# Patient Record
Sex: Male | Born: 1965 | Race: White | Hispanic: No | Smoking: Former smoker
Health system: Southern US, Community
[De-identification: ages and names within clinical notes are randomized; demographics above are authoritative.]

## PROBLEM LIST (undated history)

## (undated) DIAGNOSIS — I10 Essential (primary) hypertension: Secondary | ICD-10-CM

---

## 2012-05-31 ENCOUNTER — Emergency Department (INDEPENDENT_AMBULATORY_CARE_PROVIDER_SITE_OTHER)
Admission: EM | Admit: 2012-05-31 | Discharge: 2012-05-31 | Disposition: A | Discharge status: Home / Self Care | Payer: 59 | Source: Home / Self Care | Attending: Emergency Medicine | Admitting: Emergency Medicine

## 2012-05-31 DIAGNOSIS — R509 Fever, unspecified: Secondary | ICD-10-CM

## 2012-05-31 DIAGNOSIS — R5383 Other fatigue: Secondary | ICD-10-CM

## 2012-05-31 DIAGNOSIS — R5381 Other malaise: Secondary | ICD-10-CM

## 2012-05-31 HISTORY — DX: Essential (primary) hypertension: I10

## 2012-05-31 LAB — POCT URINALYSIS DIP (MANUAL ENTRY)
Leukocytes, UA: NEGATIVE
Protein Ur, POC: 30
Urobilinogen, UA: 0.2 (ref 0–1)

## 2012-05-31 LAB — BASIC METABOLIC PANEL
Glucose, Bld: 114 mg/dL — ABNORMAL HIGH (ref 70–99)
Potassium: 4.1 mEq/L (ref 3.5–5.3)
Sodium: 134 mEq/L — ABNORMAL LOW (ref 135–145)

## 2012-05-31 LAB — POCT CBC W AUTO DIFF (K'VILLE URGENT CARE)

## 2012-05-31 LAB — POCT INFLUENZA A/B: Influenza A, POC: NEGATIVE

## 2012-05-31 MED ORDER — AMOXICILLIN-POT CLAVULANATE 875-125 MG PO TABS: 1.0000 | ORAL_TABLET | Freq: Two times a day (BID) | ORAL | Status: DC

## 2012-05-31 NOTE — ED Provider Notes (Signed)
History     CSN: [id number]  Arrival date & time 05/31/12  0915   First MD Initiated Contact with Patient 05/31/12 [phone]      Chief Complaint  Patient presents with  . Fever    (Consider location/radiation/quality/duration/timing/severity/associated sxs/prior treatment) HPI Marcus Henry is a 46 y.o. male who complains of onset of vague symptoms for 2 days.  The symptoms are constant and mild-moderate in severity.  He also complains that his urine has been darker recently.  No known sick contacts.  No flu shot this year.  He did have some right upper quadrant cramping pain while at work about 2 days ago but that went away after Motrin.  His wife is here with him and states that he is rarely sick and that's why they came here. No sore throat No cough No pleuritic pain No wheezing No nasal congestion No post-nasal drainage No sinus pain/pressure No chest congestion No itchy/red eyes No earache No hemoptysis No SOB + chills/sweats + fever No nausea No vomiting No abdominal pain No diarrhea No skin rashes + fatigue + myalgias + headache    Past Medical History  Diagnosis Date  . Hypertension     borderline    No past surgical history on file.  No family history on file.  History  Substance Use Topics  . Smoking status: Former Games developer  . Smokeless tobacco: Not on file  . Alcohol Use: No      Review of Systems  All other systems reviewed and are negative.    Allergies  Review of patient's allergies indicates no known allergies.  Home Medications   Current Outpatient Rx  Name Route Sig Dispense Refill  . AMOXICILLIN-POT CLAVULANATE 875-125 MG PO TABS Oral Take 1 tablet by mouth 2 (two) times daily. 14 tablet 0    There were no vitals taken for this visit.  Physical Exam  Nursing note and vitals reviewed. Constitutional: He is oriented to person, place, and time. He appears well-developed and well-nourished.  Non-toxic appearance. He does not appear ill.    HENT:  Head: Normocephalic and atraumatic.  Right Ear: Tympanic membrane, external ear and ear canal normal.  Left Ear: Tympanic membrane, external ear and ear canal normal.  Nose: Mucosal edema present. No rhinorrhea.  Mouth/Throat: No oropharyngeal exudate, posterior oropharyngeal edema or posterior oropharyngeal erythema.  Eyes: No scleral icterus.  Neck: Neck supple.  Cardiovascular: Regular rhythm and normal heart sounds.   Pulmonary/Chest: Effort normal and breath sounds normal. No respiratory distress.  Abdominal: There is no tenderness. There is no tenderness at McBurney's point and negative Murphy's sign.  Neurological: He is alert and oriented to person, place, and time.  Skin: Skin is warm and dry. No rash noted.  Psychiatric: He has a normal mood and affect. His speech is normal.    ED Course  Procedures (including critical care time)  Labs Reviewed - No data to display No results found.   1. Fever   2. Fatigue       MDM   Because of his vague symptoms and normal physical examination, the first and we did was a urinalysis and a flu test.  The flu test negative in the urine test only shows a bit of protein but no signs of infection.  A urine culture was sent out.  A CBC was done in clinic in a basic metabolic panel was sent out.  I also gave him prescription for Augmentin and we will followup with him on  the telephone in a few days with test results.  If he is getting worse, he'll need to go to emergency room or followup with his PCP.   Marlaine Hind, MD 05/31/12 [phone]

## 2012-05-31 NOTE — ED Notes (Signed)
States he had a fever on Friday morning denies dysuria and chest pain c/o headache, eye pain, fatigue and dizziness

## 2012-06-01 LAB — URINE CULTURE: Colony Count: NO GROWTH

## 2012-06-03 ENCOUNTER — Telehealth: Payer: Self-pay | Admitting: *Deleted

## 2014-04-22 DIAGNOSIS — S76219A Strain of adductor muscle, fascia and tendon of unspecified thigh, initial encounter: Secondary | ICD-10-CM | POA: Insufficient documentation

## 2015-12-30 ENCOUNTER — Encounter: Payer: Self-pay | Admitting: Emergency Medicine

## 2015-12-30 ENCOUNTER — Emergency Department
Admission: EM | Admit: 2015-12-30 | Discharge: 2015-12-30 | Disposition: A | Discharge status: Home / Self Care | Payer: Managed Care, Other (non HMO) | Source: Home / Self Care | Attending: Emergency Medicine | Admitting: Emergency Medicine

## 2015-12-30 ENCOUNTER — Emergency Department (INDEPENDENT_AMBULATORY_CARE_PROVIDER_SITE_OTHER): Payer: Managed Care, Other (non HMO)

## 2015-12-30 DIAGNOSIS — M25562 Pain in left knee: Secondary | ICD-10-CM | POA: Diagnosis not present

## 2015-12-30 LAB — POCT CBC W AUTO DIFF (K'VILLE URGENT CARE)

## 2015-12-30 MED ORDER — HYDROCODONE-ACETAMINOPHEN 5-325 MG PO TABS: 2.0000 | ORAL_TABLET | ORAL | Status: DC | PRN

## 2015-12-30 MED ORDER — MELOXICAM 7.5 MG PO TABS: 7.5000 mg | ORAL_TABLET | Freq: Every day | ORAL | Status: DC

## 2015-12-30 NOTE — ED Provider Notes (Signed)
CSN: [id number]     Arrival date & time 12/30/15  1505 History   First MD Initiated Contact with Patient 12/30/15 1616     Chief Complaint  Patient presents with  . Knee Pain   (Consider location/radiation/quality/duration/timing/severity/associated sxs/prior Treatment) Patient is a 50 y.o. male presenting with knee pain. The history is provided by the patient. No language interpreter was used.  Knee Pain Location:  Knee Time since incident:  3 months Injury: no   Knee location:  L knee Pain details:    Quality:  Aching   Radiates to:  Does not radiate   Severity:  Moderate   Onset quality:  Gradual   Duration:  3 months   Timing:  Constant   Progression:  Worsening Chronicity:  New Dislocation: no   Foreign body present:  No foreign bodies Relieved by:  Nothing Worsened by:  Nothing tried Ineffective treatments:  None tried Associated symptoms: swelling   Associated symptoms: no muscle weakness   Risk factors: no frequent fractures and no known bone disorder   Pt reports knee pain for 3 months.  Pt has pain with moving leg.  Pt reports occasional pop with pain.  No known injury.  Pt's wife is worried that pt is anemic and wants him to have a cbc as well as evaluation of his knee  Past Medical History  Diagnosis Date  . Hypertension     borderline   History reviewed. No pertinent past surgical history. History reviewed. No pertinent family history. Social History  Substance Use Topics  . Smoking status: Former Research scientist (life sciences)  . Smokeless tobacco: None  . Alcohol Use: No    Review of Systems  Musculoskeletal: Positive for myalgias and joint swelling.  All other systems reviewed and are negative.   Allergies  Review of patient's allergies indicates no known allergies.  Home Medications   Prior to Admission medications   Medication Sig Start Date End Date Taking? Authorizing Provider  amoxicillin-clavulanate (AUGMENTIN) 875-125 MG per tablet Take 1 tablet by mouth 2  (two) times daily. 05/31/12   Janeann Forehand, MD  HYDROcodone-acetaminophen (NORCO/VICODIN) 5-325 MG tablet Take 2 tablets by mouth every 4 (four) hours as needed. 12/30/15   Fransico Meadow, PA-C  meloxicam (MOBIC) 7.5 MG tablet Take 1 tablet (7.5 mg total) by mouth daily. 12/30/15   Fransico Meadow, PA-C   Meds Ordered and Administered this Visit  Medications - No data to display  BP 143/74 mmHg  Pulse 76  Temp(Src) 99.1 F (37.3 C) (Oral)  Resp 16  Ht 6\' 2"  (1.88 m)  Wt 246 lb (111.585 kg)  BMI 31.57 kg/m2  SpO2 97% No data found.   Physical Exam  Constitutional: He is oriented to person, place, and time. He appears well-developed and well-nourished.  HENT:  Head: Normocephalic.  Eyes: EOM are normal.  Neck: Normal range of motion.  Abdominal: He exhibits no distension.  Musculoskeletal: He exhibits tenderness.  Tender right knee medial aspect,  Pain with movement,  nv and ns intact  No instability,  Negative drawer.   Neurological: He is alert and oriented to person, place, and time.  Psychiatric: He has a normal mood and affect.  Nursing note and vitals reviewed.   ED Course  Procedures (including critical care time)  Labs Review Labs Reviewed  POCT CBC W AUTO DIFF ([place])   CBC is normal   Imaging Review Dg Knee Complete 4 Views Left  12/30/2015  CLINICAL DATA:  Knee pain for 3 months.  No trauma. EXAM: LEFT KNEE - COMPLETE 4+ VIEW COMPARISON:  None. FINDINGS: No acute fracture or dislocation. A tiny calcification adjacent to the medial distal femur is consistent with a previous medial collateral ligament injury. No acute abnormalities. IMPRESSION: No acute abnormalities. Electronically Signed   By: Dorise Bullion III M.D   On: 12/30/2015 16:10     Visual Acuity Review  Right Eye Distance:   Left Eye Distance:   Bilateral Distance:    Right Eye Near:   Left Eye Near:    Bilateral Near:         MDM xray shows possible calcification from  medial collateral tear.  Pt    1. Knee pain, left    Meds ordered this encounter  Medications  . meloxicam (MOBIC) 7.5 MG tablet    Sig: Take 1 tablet (7.5 mg total) by mouth daily.    Dispense:  30 tablet    Refill:  0    Order Specific Question:  Supervising Provider    Answer:  Burnett Harry, DAVID [[id number]]  . HYDROcodone-acetaminophen (NORCO/VICODIN) 5-325 MG tablet    Sig: Take 2 tablets by mouth every 4 (four) hours as needed.    Dispense:  10 tablet    Refill:  0    Order Specific Question:  Supervising Provider    Answer:  Burnett Harry, DAVID [[id number]]  An After Visit Summary was printed and given to the patient.    [place], PA-C 12/30/15 [phone]

## 2015-12-30 NOTE — ED Notes (Signed)
Reports pain in left knee at intermittent levels of severity for past 3 months; onset not associated with any injury or activity; has tried OTCs, ice, heat, braces and patches without relief.

## 2015-12-30 NOTE — Discharge Instructions (Signed)
Medial Collateral Knee Ligament Sprain With Phase I Rehab The medial collateral ligament (MCL) of the knee helps hold the knee joint in proper alignment and prevents the bones from shifting out of alignment (displacing) to the inside (medially). Injury to the knee may cause a tear in the MCL ligament (sprain). Sprains may heal without treatment, but this often results in a loose joint. Sprains are classified into three categories. Grade 1 sprains cause pain, but the tendon is not lengthened. Grade 2 sprains include a lengthened ligament, due to the ligament being stretched or partially ruptured. With grade 2 sprains, there is still function, although possibly decreased. Grade 3 sprains involve a complete tear of the tendon or muscle, and function is usually impaired. SYMPTOMS   Pain and tenderness on the inner side of the knee.  A "pop," tearing or pulling sensation at the time of injury.  Bruising (contusion) at the site of injury, within 48 hours of injury.  Knee stiffness.  Limping, often walking with the knee bent. CAUSES  An MCL sprain occurs when a force is placed on the ligament that is greater than it can handle. Common mechanisms of injury include:  Direct hit (trauma) to the outer side of the knee, especially if the foot is planted on the ground.  Forceful pivoting of the body and leg, while the foot is planted on the ground. RISK INCREASES WITH:  Contact sports (football, rugby).  Sports that require pivoting or cutting (soccer).  Poor knee strength and flexibility.  Improper equipment use. PREVENTION  Warm up and stretch properly before activity.  Maintain physical fitness:  Strength, flexibility and endurance.  Cardiovascular fitness.  Wear properly fitted protective equipment (correct length of cleats for surface).  Functional braces may be effective in preventing injury. PROGNOSIS  MCL tears usually heal without the need for surgery. Sometimes however, surgery  is required. RELATED COMPLICATIONS  Frequently recurring symptoms, such as the knee giving way, knee instability or knee swelling.  Injury to other structures in the knee joint:  Meniscal cartilage, resulting in locking and swelling of the knee.  Articular cartilage, resulting in knee arthritis.  Other ligaments of the knee.  Injury to nerves, resulting in numbness of the outer leg, foot or ankle and weakness or paralysis, with inability to raise the ankle or toes.  Knee stiffness. TREATMENT Treatment first involves the use of ice and medicine, to reduce pain and inflammation. The use of strengthening and stretching exercises may help reduce pain with activity. These exercises may be performed at home, but referral to a therapist is often advised. You may be advised to walk with crutches until you are able to walk without a limp. Your caregiver may provide you with a hinged knee brace to help regain a full range of motion, while also protecting the injured knee. For severe MCL injuries or injuries that involve other ligaments of the knee, surgery is often advised. MEDICATION  Do not take pain medicine for 7 days before surgery.  Only use over-the-counter pain medicine as directed by your caregiver.  Only use prescription pain relievers as directed and only in needed amounts. HEAT AND COLD  Cold treatment (icing) should be applied for 10 to 15 minutes every 2 to 3 hours for inflammation and pain, and immediately after any activity, that aggravates the symptoms. Use ice packs or an ice massage.  Heat treatment may be used before performing stretching and strengthening activities prescribed by your caregiver, physical therapist or athletic trainer. Use  a heat pack or warm water soak. SEEK MEDICAL CARE IF:   Symptoms get worse or do not improve in 4 to 6 weeks, despite treatment.  New, unexplained symptoms develop. EXERCISES  PHASE I EXERCISES  RANGE OF MOTION (ROM) AND STRETCHING  EXERCISES-Medial Collateral Knee Ligament Sprain Phase I These are some of the initial exercises that your physician, physical therapist or athletic trainer may have you perform to begin your rehabilitation. When you demonstrate gains in your flexibility and strength, your caregiver may progress you to Phase II exercises. As you perform these exercises, remember:  These initial exercises are intended to be gentle. They will help you restore motion without increasing any swelling.  Completing these exercises allows less painful movement and prepares you for the more aggressive strengthening exercises in Phase II.  An effective stretch should be held for at least 30 seconds.  A stretch should never be painful. You should only feel a gentle lengthening or release in the stretched tissue. RANGE OF MOTION-Knee Flexion, Active  Lie on your back with both knees straight. (If this causes back discomfort, bend your healthy knee, placing your foot flat on the floor.)  Slowly slide your heel back toward your buttocks until you feel a gentle stretch in the front of your knee or thigh.  Hold for __________ seconds. Slowly slide your heel back to the starting position. Repeat __________ times. Complete this exercise __________ times per day. STRETCH-Knee Flexion, Supine  Lie on the floor with your right / left heel and foot lightly touching the wall. (Place both feet on the wall if you do not use a door frame.)  Without using any effort, allow gravity to slide your foot down the wall slowly until you feel a gentle stretch in the front of your right / left knee.  Hold this stretch for __________ seconds. Then return the leg to the starting position, using your health leg for help, if needed. Repeat __________ times. Complete this stretch __________ times per day. RANGE OF MOTION-Knee Flexion and Extension, Active-Assisted  Sit on the edge of a table or chair with your thighs firmly supported. It may be  helpful to place a folded towel under the end of your right / left thigh.  Flexion (bending): Place the ankle of your healthy leg on top of the other ankle. Use your healthy leg to gently bend your right / left knee until you feel a mild tension across the top of your knee.  Hold for __________ seconds.  Extension (straightening): Switch your ankles so your right / left leg is on top. Use your healthy leg to straighten your right / left knee until you feel a mild tension on the backside of your knee.  Hold for __________ seconds. Repeat __________ times. Complete this exercise __________ times per day. STRETCH-Knee Extension Sitting  Sit with your right / left leg/heel propped on another chair, coffee table, or foot stool.  Allow your leg muscles to relax, letting gravity straighten out your knee.*  You should feel a stretch behind your right / left knee. Hold this position for __________ seconds. Repeat __________ times. Complete this stretch __________ times per day. *Your physician, physical therapist or athletic trainer may instruct you to place a __________ weight on your thigh, just above your kneecap, to deepen the stretch. STRENGTHENING EXERCISES-Medial Collateral Knee Ligament Sprain Phase I These exercises may help you when beginning to rehabilitate your injury. They may resolve your symptoms with or without further involvement from  your physician, physical therapist or athletic trainer. While completing these exercises, remember:   In order to return to more demanding activities, you will likely need to progress to more challenging exercises. Your physician, physical therapist or athletic trainer will advance your exercises when your tissues show adequate healing and your muscles demonstrate increased strength.  Muscles can gain both the endurance and the strength needed for everyday activities through controlled exercises.  Complete these exercises as instructed by your  physician, physical therapist or athletic trainer. Increase the resistance and repetitions only as guided by your caregiver. STRENGTH-Quadriceps, Isometrics  Lie on your back with your right / left leg extended and your opposite knee bent.  Gradually tense the muscles in the front of your right / left thigh. You should see either your kneecap slide up toward your hip or an increased dimpling just above the knee. This motion will push the back of the knee down toward the floor, mat or bed on which you are lying.  Hold the muscle as tight as you can without increasing your pain for __________ seconds.  Relax the muscles slowly and completely in between each repetition. Repeat __________ times. Complete this exercise __________ times per day. STRENGTH-Quadriceps, Short Arcs  Lie on your back. Place a __________ inch towel roll under your knee so that the knee slightly bends.  Raise only your lower leg by tightening the muscles in the front of your thigh. Do not allow your thigh to rise.  Hold this position for __________ seconds. Repeat __________ times. Complete this exercise __________ times per day. OPTIONAL ANKLE WEIGHTS: Begin with ____________________, but DO NOT exceed ____________________. Increase in 1 pound/0.5 kilogram increments.  STRENGTH--Quadriceps, Straight Leg Raises Quality counts! Watch for signs that the quadriceps muscle is working, to be sure you are strengthening the correct muscles and not "cheating" by substituting with healthier muscles.  Lay on your back with your right / left leg extended and your opposite knee bent.  Tense the muscles in the front of your right / left thigh. You should see either your knee cap slide up or increased dimpling just above the knee. Your thigh may even shake a bit.  Tighten these muscles even more and raise your leg 4 to 6 inches off the floor. Hold for __________ seconds.  Keeping these muscles tense, lower your leg.  Relax the  muscles slowly and completely in between each repetition. Repeat __________ times. Complete this exercise __________ times per day. STRENGTH-Hamstring, Isometrics  Lie on your back on a firm surface.  Bend your right / left knee approximately __________ degrees.  Dig your heel into the surface as if you are trying to pull it toward your buttocks. Tighten the muscles in the back of your thighs to "dig" as hard as you can, without increasing any pain.  Hold this position for __________ seconds.  Release the tension gradually and allow your muscle to completely relax for __________ seconds in between each exercise. Repeat __________ times. Complete this exercise __________ times per day. STRENGTH-Hamstring, Curls  Lay on your stomach with your legs extended. (If you lay on a bed, your feet may hang over the edge.)  Tighten the muscles in the back of your thigh to bend your right / left knee up to 90 degrees. Keep your hips flat on the bed.  Hold this position for __________ seconds.  Slowly lower your leg back to the starting position. Repeat __________ times. Complete this exercise __________ times per day. OPTIONAL  ANKLE WEIGHTS: Begin with ____________________, but DO NOT exceed ____________________. Increase in 1 pound/0.5 kilogram increments.    This information is not intended to replace advice given to you by your health care provider. Make sure you discuss any questions you have with your health care provider.   Document Released: 08/13/2005 Document Revised: 09/03/2014 Document Reviewed: 11/25/2008 Elsevier Interactive Patient Education [employer].

## 2016-01-20 ENCOUNTER — Encounter: Payer: Self-pay | Admitting: Sports Medicine

## 2016-01-20 ENCOUNTER — Ambulatory Visit (INDEPENDENT_AMBULATORY_CARE_PROVIDER_SITE_OTHER): Payer: Managed Care, Other (non HMO) | Admitting: Sports Medicine

## 2016-01-20 VITALS — BP 122/72 | HR 74 | Resp 16 | Wt 250.1 lb

## 2016-01-20 DIAGNOSIS — M1712 Unilateral primary osteoarthritis, left knee: Secondary | ICD-10-CM | POA: Diagnosis not present

## 2016-01-20 NOTE — Assessment & Plan Note (Signed)
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

## 2016-01-20 NOTE — Progress Notes (Signed)
   Subjective:    I'm seeing this patient as a consultation for: Caryl Ada, PA-C   CC: Left knee pain  HPI: For months this pleasant 50 year old male has had pain that he localizes along the medial aspect of his left knee, moderate, persistent without trauma, mild swelling. No mechanical symptoms.  Past medical history, Surgical history, Family history not pertinant except as noted below, Social history, Allergies, and medications have been entered into the medical record, reviewed, and no changes needed.   Review of Systems: No headache, visual changes, nausea, vomiting, diarrhea, constipation, dizziness, abdominal pain, skin rash, fevers, chills, night sweats, weight loss, swollen lymph nodes, body aches, joint swelling, muscle aches, chest pain, shortness of breath, mood changes, visual or auditory hallucinations.   Objective:   General: Well Developed, well nourished, and in no acute distress.  Neuro/Psych: Alert and oriented x3, extra-ocular muscles intact, able to move all 4 extremities, sensation grossly intact. Skin: Warm and dry, no rashes noted.  Respiratory: Not using accessory muscles, speaking in full sentences, trachea midline.  Cardiovascular: Pulses palpable, no extremity edema. Abdomen: Does not appear distended. Left Knee: Swollen with a mild effusion and tenderness at the medial joint line. ROM normal in flexion and extension and lower leg rotation. Ligaments with solid consistent endpoints including ACL, PCL, LCL, MCL. Negative Mcmurray's and provocative meniscal tests. Non painful patellar compression. Patellar and quadriceps tendons unremarkable. Hamstring and quadriceps strength is normal.  Procedure: Real-time Ultrasound Guided Injection of left knee Device: GE Logiq E  Verbal informed consent obtained.  Time-out conducted.  Noted no overlying erythema, induration, or other signs of local infection.  Skin prepped in a sterile fashion.  Local anesthesia:  Topical Ethyl chloride.  With sterile technique and under real time ultrasound guidance:  1 mL kenalog 40, 2 mL lidocaine, 2 mL Marcaine injected easily. Completed without difficulty  Pain immediately resolved suggesting accurate placement of the medication.  Advised to call if fevers/chills, erythema, induration, drainage, or persistent bleeding.  Images permanently stored and available for review in the ultrasound unit.  Impression: Technically successful ultrasound guided injection.  Impression and Recommendations:   This case required medical decision making of moderate complexity.

## 2016-02-03 ENCOUNTER — Ambulatory Visit (INDEPENDENT_AMBULATORY_CARE_PROVIDER_SITE_OTHER): Payer: Managed Care, Other (non HMO) | Admitting: Physician Assistant

## 2016-02-03 ENCOUNTER — Encounter: Payer: Self-pay | Admitting: Physician Assistant

## 2016-02-03 ENCOUNTER — Other Ambulatory Visit: Payer: Self-pay | Admitting: Physician Assistant

## 2016-02-03 VITALS — BP 108/65 | HR 86 | Ht 74.0 in | Wt 247.0 lb

## 2016-02-03 DIAGNOSIS — E669 Obesity, unspecified: Secondary | ICD-10-CM

## 2016-02-03 DIAGNOSIS — Z0000 Encounter for general adult medical examination without abnormal findings: Secondary | ICD-10-CM

## 2016-02-03 DIAGNOSIS — R5383 Other fatigue: Secondary | ICD-10-CM | POA: Diagnosis not present

## 2016-02-03 DIAGNOSIS — R0681 Apnea, not elsewhere classified: Secondary | ICD-10-CM

## 2016-02-03 DIAGNOSIS — Z13220 Encounter for screening for lipoid disorders: Secondary | ICD-10-CM

## 2016-02-03 DIAGNOSIS — R0683 Snoring: Secondary | ICD-10-CM

## 2016-02-03 DIAGNOSIS — Z131 Encounter for screening for diabetes mellitus: Secondary | ICD-10-CM

## 2016-02-03 DIAGNOSIS — Q674 Other congenital deformities of skull, face and jaw: Secondary | ICD-10-CM

## 2016-02-03 DIAGNOSIS — R0602 Shortness of breath: Secondary | ICD-10-CM

## 2016-02-03 NOTE — Patient Instructions (Addendum)
Tumeric 500mg  twice a day for joint pain.   Will get scheduled for sleep study.   Will call with lab results.   Keeping you healthy  Get these tests  Blood pressure- Have your blood pressure checked once a year by your healthcare provider.  Normal blood pressure is 120/80.  Weight- Have your body mass index (BMI) calculated to screen for obesity.  BMI is a measure of body fat based on height and weight. You can also calculate your own BMI at [employer].it.  Cholesterol- Have your cholesterol checked regularly starting at age 61, sooner may be necessary if you have diabetes, high blood pressure, if a family member developed heart diseases at an early age or if you smoke.   Chlamydia, HIV, and other sexual transmitted disease- Get screened each year until the age of 6 then within three months of each new sexual partner.  Diabetes- Have your blood sugar checked regularly if you have high blood pressure, high cholesterol, a family history of diabetes or if you are overweight.  Get these vaccines  Flu shot- Every fall.  Tetanus shot- Every 10 years.  Menactra- Single dose; prevents meningitis.  Take these steps  Don't smoke- If you do smoke, ask your healthcare provider about quitting. For tips on how to quit, go to www.smokefree.gov or call 1-800-QUIT-NOW.  Be physically active- Exercise 5 days a week for at least 30 minutes.  If you are not already physically active start slow and gradually work up to 30 minutes of moderate physical activity.  Examples of moderate activity include walking briskly, mowing the yard, dancing, swimming bicycling, etc.  Eat a healthy diet- Eat a variety of healthy foods such as fruits, vegetables, low fat milk, low fat cheese, yogurt, lean meats, poultry, fish, beans, tofu, etc.  For more information on healthy eating, go to www.thenutritionsource.org  Drink alcohol in moderation- Limit alcohol intake two drinks or less a day.  Never drink  and drive.  Dentist- Brush and floss teeth twice daily; visit your dentis twice a year.  Depression-Your emotional health is as important as your physical health.  If you're feeling down, losing interest in things you normally enjoy please talk with your healthcare provider.  Gun Safety- If you keep a gun in your home, keep it unloaded and with the safety lock on.  Bullets should be stored separately.  Helmet use- Always wear a helmet when riding a motorcycle, bicycle, rollerblading or skateboarding.  Safe sex- If you may be exposed to a sexually transmitted infection, use a condom  Seat belts- Seat bels can save your life; always wear one.  Smoke/Carbon Monoxide detectors- These detectors need to be installed on the appropriate level of your home.  Replace batteries at least once a year.  Skin Cancer- When out in the sun, cover up and use sunscreen SPF 15 or higher.  Violence- If anyone is threatening or hurting you, please tell your healthcare provider.

## 2016-02-04 LAB — CBC WITH DIFFERENTIAL/PLATELET
BASOS PCT: 1 %
Basophils Absolute: 64 cells/uL (ref 0–200)
Eosinophils Absolute: 128 cells/uL (ref 15–500)
Eosinophils Relative: 2 %
HEMATOCRIT: 45.2 % (ref 38.5–50.0)
Hemoglobin: 15.1 g/dL (ref 13.2–17.1)
LYMPHS ABS: 1792 {cells}/uL (ref 850–3900)
LYMPHS PCT: 28 %
MCH: 30.1 pg (ref 27.0–33.0)
MCHC: 33.4 g/dL (ref 32.0–36.0)
MCV: 90.2 fL (ref 80.0–100.0)
MPV: 10.9 fL (ref 7.5–12.5)
Monocytes Absolute: 320 cells/uL (ref 200–950)
Monocytes Relative: 5 %
NEUTROS ABS: 4096 {cells}/uL (ref 1500–7800)
Neutrophils Relative %: 64 %
Platelets: 217 10*3/uL (ref 140–400)
RBC: 5.01 MIL/uL (ref 4.20–5.80)
RDW: 13.9 % (ref 11.0–15.0)
WBC: 6.4 10*3/uL (ref 3.8–10.8)

## 2016-02-04 LAB — COMPLETE METABOLIC PANEL WITH GFR
ALT: 44 U/L (ref 9–46)
AST: 30 U/L (ref 10–40)
Albumin: 4.1 g/dL (ref 3.6–5.1)
Alkaline Phosphatase: 49 U/L (ref 40–115)
BILIRUBIN TOTAL: 0.6 mg/dL (ref 0.2–1.2)
BUN: 19 mg/dL (ref 7–25)
CHLORIDE: 104 mmol/L (ref 98–110)
CO2: 22 mmol/L (ref 20–31)
CREATININE: 0.87 mg/dL (ref 0.60–1.35)
Calcium: 8.8 mg/dL (ref 8.6–10.3)
GFR, Est Non African American: >89 mL/min (ref >=60)
GLUCOSE: 116 mg/dL — AB (ref 65–99)
Potassium: 4.2 mmol/L (ref 3.5–5.3)
SODIUM: 139 mmol/L (ref 135–146)
TOTAL PROTEIN: 6.6 g/dL (ref 6.1–8.1)

## 2016-02-04 LAB — LIPID PANEL
Cholesterol: 223 mg/dL — ABNORMAL HIGH (ref 125–200)
HDL: 50 mg/dL (ref >=40)
LDL CALC: 142 mg/dL — AB (ref <130)
Total CHOL/HDL Ratio: 4.5 Ratio (ref <=5.0)
Triglycerides: 156 mg/dL — ABNORMAL HIGH (ref <150)
VLDL: 31 mg/dL — AB (ref <30)

## 2016-02-04 LAB — VITAMIN B12: VITAMIN B 12: 1150 pg/mL — AB (ref 200–1100)

## 2016-02-04 LAB — TSH: TSH: 0.74 m[IU]/L (ref 0.40–4.50)

## 2016-02-04 LAB — VITAMIN D 25 HYDROXY (VIT D DEFICIENCY, FRACTURES): Vit D, 25-Hydroxy: 22 ng/mL — ABNORMAL LOW (ref 30–100)

## 2016-02-06 ENCOUNTER — Encounter: Payer: Self-pay | Admitting: Physician Assistant

## 2016-02-06 DIAGNOSIS — R5383 Other fatigue: Secondary | ICD-10-CM | POA: Insufficient documentation

## 2016-02-06 DIAGNOSIS — R7301 Impaired fasting glucose: Secondary | ICD-10-CM | POA: Insufficient documentation

## 2016-02-06 DIAGNOSIS — R0681 Apnea, not elsewhere classified: Secondary | ICD-10-CM | POA: Insufficient documentation

## 2016-02-06 DIAGNOSIS — R0602 Shortness of breath: Secondary | ICD-10-CM | POA: Insufficient documentation

## 2016-02-06 DIAGNOSIS — E669 Obesity, unspecified: Secondary | ICD-10-CM | POA: Insufficient documentation

## 2016-02-06 DIAGNOSIS — Q674 Other congenital deformities of skull, face and jaw: Secondary | ICD-10-CM | POA: Insufficient documentation

## 2016-02-06 DIAGNOSIS — E559 Vitamin D deficiency, unspecified: Secondary | ICD-10-CM | POA: Insufficient documentation

## 2016-02-06 DIAGNOSIS — R0683 Snoring: Secondary | ICD-10-CM | POA: Insufficient documentation

## 2016-02-06 NOTE — Progress Notes (Signed)
Subjective:    Patient ID: Marcus Henry, male    DOB: 1966-01-12, 50 y.o.   MRN: [id number]  HPI Pt is a 50 yo male who presents to the clinic with his wife to establish care.   .. Active Ambulatory Problems    Diagnosis Date Noted  . Primary osteoarthritis of left knee 01/20/2016   Resolved Ambulatory Problems    Diagnosis Date Noted  . No Resolved Ambulatory Problems   Past Medical History  Diagnosis Date  . Hypertension    .Marland Kitchen Social History   Social History  . Marital Status: Married    Spouse Name: N/A  . Number of Children: N/A  . Years of Education: N/A   Occupational History  . Not on file.   Social History Main Topics  . Smoking status: Former Research scientist (life sciences)  . Smokeless tobacco: Not on file  . Alcohol Use: Yes  . Drug Use: No  . Sexual Activity: Yes   Other Topics Concern  . Not on file   Social History Narrative    Patient is on been seen by health care provider since at least 2015. He does have a few concerns today. He is having some fatigue. It seems to worsening over the last month. He does work third shift and has to sleep during the day. His wife works regular shift and at times would like to spend time with him during the day and he is not sleeping. He has no trouble getting to sleep. His wife does report that he snores and there are times where she feels like he is not breathing. Patient has always had trouble breathing out of his nose and wonders if he could have a deviated septum. He is also had some episodes of shortness of breath. Denies any wheezing or coughing. He just that time when he moves from location to location social breath. He admits he does not exercise and is not in the great physical shape. He just feels like he has to work and sleeps and that is all he does.   Review of Systems  All other systems reviewed and are negative.      Objective:   Physical Exam BP 108/65 mmHg  Pulse 86  Ht 6\' 2"  (1.88 m)  Wt 247 lb (112.038 kg)  BMI  31.70 kg/m2  PF 653 L/min  General Appearance:    Alert, cooperative, no distress, appears stated age, obese  Head:    Normocephalic, without obvious abnormality, atraumatic  Eyes:    PERRL, conjunctiva/corneas clear, EOM's intact, fundi    benign, both eyes       Ears:    Normal TM's and external ear canals, both ears  Nose:   Nares normal, deviated septum to the left, mucosa normal, no drainage    or sinus tenderness  Throat:   Lips, mucosa, and tongue normal; teeth and gums normal  Neck:   Supple, symmetrical, trachea midline, no adenopathy;       thyroid:  No enlargement/tenderness/nodules; no carotid   bruit or JVD  Back:     Symmetric, no curvature, ROM normal, no CVA tenderness  Lungs:     Clear to auscultation bilaterally, respirations unlabored  Chest wall:    No tenderness or deformity  Heart:    Regular rate and rhythm, S1 and S2 normal, no murmur, rub   or gallop  Abdomen:     Soft, non-tender, bowel sounds active all four quadrants,    no masses, no organomegaly  Extremities:   Extremities normal, atraumatic, no cyanosis or edema  Pulses:   2+ and symmetric all extremities  Skin:   Skin color, texture, turgor normal, no rashes or lesions  Lymph nodes:   Cervical, supraclavicular, and axillary nodes normal  Neurologic:   CNII-XII intact. Normal strength, sensation and reflexes      throughout         Assessment & Plan:  CPE- lipid and cmp ordered. Encouraged asa 81mg  daily. Encouraged weight loss. AUA was 0.   Obesity/snoring/apenic episode/no energy- TSH, b12, vitamin D ordered. STOP BANG was high risk with 5 yes answers. Ordered sleep study testing. Discussed weight loss. Come back to discuss more.   Deviated septum- ENT referral. If repaired could help sleep apnea and SOB. Denies any trauma to the nose.   SOB- could be due to deviated septum. ENT referral placed. Peak flow in green. reassured patient.  Follow up if worsening.

## 2016-02-07 LAB — HEMOGLOBIN A1C
HEMOGLOBIN A1C: 6.3 % — AB (ref <5.7)
Mean Plasma Glucose: 134 mg/dL

## 2016-02-08 ENCOUNTER — Encounter: Payer: Self-pay | Admitting: Physician Assistant

## 2016-02-08 DIAGNOSIS — R7303 Prediabetes: Secondary | ICD-10-CM | POA: Insufficient documentation

## 2016-02-08 DIAGNOSIS — E291 Testicular hypofunction: Secondary | ICD-10-CM | POA: Insufficient documentation

## 2016-02-08 LAB — TESTOSTERONE, FREE AND TOTAL (INCLUDES SHBG)-(MALES)
Sex Hormone Binding: 21 nmol/L (ref 10–50)
TESTOSTERONE FREE: 55 pg/mL (ref 47.0–244.0)
TESTOSTERONE-% FREE: 2.4 % (ref 1.6–2.9)
Testosterone: 226 ng/dL — ABNORMAL LOW (ref 250–827)

## 2016-02-10 ENCOUNTER — Other Ambulatory Visit: Payer: Self-pay | Admitting: *Deleted

## 2016-02-10 MED ORDER — VITAMIN D (ERGOCALCIFEROL) 1.25 MG (50000 UNIT) PO CAPS: 50000.0000 [IU] | ORAL_CAPSULE | ORAL | Status: DC

## 2016-02-17 ENCOUNTER — Encounter: Payer: Self-pay | Admitting: Sports Medicine

## 2016-02-17 ENCOUNTER — Ambulatory Visit (INDEPENDENT_AMBULATORY_CARE_PROVIDER_SITE_OTHER): Payer: Managed Care, Other (non HMO) | Admitting: Sports Medicine

## 2016-02-17 DIAGNOSIS — E291 Testicular hypofunction: Secondary | ICD-10-CM

## 2016-02-17 DIAGNOSIS — M1712 Unilateral primary osteoarthritis, left knee: Secondary | ICD-10-CM | POA: Diagnosis not present

## 2016-02-17 DIAGNOSIS — R7989 Other specified abnormal findings of blood chemistry: Secondary | ICD-10-CM

## 2016-02-17 NOTE — Assessment & Plan Note (Signed)
Has a low testosterone level, needs a recheck before we can consider supplementation.

## 2016-02-17 NOTE — Progress Notes (Signed)
  Subjective:    CC: Follow-up  HPI: Left knee osteoarthritis: Likely degenerative meniscal tear, good response to the injection monthly ago but still has some pain, swelling.  Hypogonadism: Needs a single additional low testosterone level before we consider supplementation.  Past medical history, Surgical history, Family history not pertinant except as noted below, Social history, Allergies, and medications have been entered into the medical record, reviewed, and no changes needed.   Review of Systems: No fevers, chills, night sweats, weight loss, chest pain, or shortness of breath.   Objective:    General: Well Developed, well nourished, and in no acute distress.  Neuro: Alert and oriented x3, extra-ocular muscles intact, sensation grossly intact.  HEENT: Normocephalic, atraumatic, pupils equal round reactive to light, neck supple, no masses, no lymphadenopathy, thyroid nonpalpable.  Skin: Warm and dry, no rashes. Cardiac: Regular rate and rhythm, no murmurs rubs or gallops, no lower extremity edema.  Respiratory: Clear to auscultation bilaterally. Not using accessory muscles, speaking in full sentences. Left Knee: Swollen with a palpable fluid wave and effusion and tenderness at the medial joint line ROM normal in flexion and extension and lower leg rotation. Ligaments with solid consistent endpoints including ACL, PCL, LCL, MCL. Negative Mcmurray's and provocative meniscal tests. Non painful patellar compression. Patellar and quadriceps tendons unremarkable. Hamstring and quadriceps strength is normal.  Procedure: Real-time Ultrasound Guided Injection of left knee Device: GE Logiq E  Verbal informed consent obtained.  Time-out conducted.  Noted no overlying erythema, induration, or other signs of local infection.  Skin prepped in a sterile fashion.  Local anesthesia: Topical Ethyl chloride.  With sterile technique and under real time ultrasound guidance:  Aspirated 20 mL  straw-colored fluid, syringe switched and 1 mL kenalog 40, 2 mL lidocaine, 2 mL Marcaine injected easily. Completed without difficulty  Pain immediately resolved suggesting accurate placement of the medication.  Advised to call if fevers/chills, erythema, induration, drainage, or persistent bleeding.  Images permanently stored and available for review in the ultrasound unit.  Impression: Technically successful ultrasound guided injection.  Impression and Recommendations:

## 2016-02-17 NOTE — Assessment & Plan Note (Signed)
Repeat aspiration and injection, at this point we are going to order an MRI, I do suspect he will proceed to arthroscopy. Not having any mechanical symptoms however.

## 2016-02-18 LAB — TESTOSTERONE: Testosterone: 161 ng/dL — ABNORMAL LOW (ref 250–827)

## 2016-02-20 ENCOUNTER — Other Ambulatory Visit: Payer: Self-pay | Admitting: *Deleted

## 2016-02-20 ENCOUNTER — Ambulatory Visit (INDEPENDENT_AMBULATORY_CARE_PROVIDER_SITE_OTHER): Payer: Managed Care, Other (non HMO)

## 2016-02-20 DIAGNOSIS — M1712 Unilateral primary osteoarthritis, left knee: Secondary | ICD-10-CM

## 2016-02-20 DIAGNOSIS — M23222 Derangement of posterior horn of medial meniscus due to old tear or injury, left knee: Secondary | ICD-10-CM

## 2016-02-20 MED ORDER — VITAMIN D (ERGOCALCIFEROL) 1.25 MG (50000 UNIT) PO CAPS: 50000.0000 [IU] | ORAL_CAPSULE | ORAL | Status: DC

## 2016-02-21 ENCOUNTER — Ambulatory Visit: Payer: Managed Care, Other (non HMO) | Admitting: Sports Medicine

## 2016-02-23 ENCOUNTER — Ambulatory Visit (INDEPENDENT_AMBULATORY_CARE_PROVIDER_SITE_OTHER): Payer: Managed Care, Other (non HMO) | Admitting: Sports Medicine

## 2016-02-23 ENCOUNTER — Encounter: Payer: Self-pay | Admitting: Sports Medicine

## 2016-02-23 VITALS — BP 146/83 | HR 72 | Wt 244.0 lb

## 2016-02-23 DIAGNOSIS — E669 Obesity, unspecified: Secondary | ICD-10-CM

## 2016-02-23 DIAGNOSIS — M1712 Unilateral primary osteoarthritis, left knee: Secondary | ICD-10-CM

## 2016-02-23 DIAGNOSIS — E291 Testicular hypofunction: Secondary | ICD-10-CM

## 2016-02-23 MED ORDER — TESTOSTERONE CYPIONATE 200 MG/ML IM SOLN
200.0000 mg | Freq: Once | INTRAMUSCULAR | Status: AC
  Administered 2016-02-23: 200 mg via INTRAMUSCULAR

## 2016-02-23 NOTE — Progress Notes (Signed)
  Subjective:    CC: Follow-up  HPI: Left knee osteoarthritis: Needed a second injection, overall doing well now.  Obesity: Would like to think about weight loss medication, he will let either myself or his PCP know what he wants to do.  Hypergonadism: With 2 low testosterone levels, fatigue, difficulty putting on lean muscle, agreeable to start testosterone injections, risks, benefits, alternatives discussed.  Past medical history, Surgical history, Family history not pertinant except as noted below, Social history, Allergies, and medications have been entered into the medical record, reviewed, and no changes needed.   Review of Systems: No fevers, chills, night sweats, weight loss, chest pain, or shortness of breath.   Objective:    General: Well Developed, well nourished, and in no acute distress.  Neuro: Alert and oriented x3, extra-ocular muscles intact, sensation grossly intact.  HEENT: Normocephalic, atraumatic, pupils equal round reactive to light, neck supple, no masses, no lymphadenopathy, thyroid nonpalpable.  Skin: Warm and dry, no rashes. Cardiac: Regular rate and rhythm, no murmurs rubs or gallops, no lower extremity edema.  Respiratory: Clear to auscultation bilaterally. Not using accessory muscles, speaking in full sentences. Left Knee: Normal to inspection with no erythema or effusion or obvious bony abnormalities. Palpation normal with no warmth or joint line tenderness or patellar tenderness or condyle tenderness. ROM normal in flexion and extension and lower leg rotation. Ligaments with solid consistent endpoints including ACL, PCL, LCL, MCL. Negative Mcmurray's and provocative meniscal tests. Non painful patellar compression. Patellar and quadriceps tendons unremarkable. Hamstring and quadriceps strength is normal.  MRI showed osteoarthritis and a degenerative meniscal tear in the medial meniscus posterior horn.  Impression and Recommendations:

## 2016-02-23 NOTE — Assessment & Plan Note (Signed)
MRI shows osteoarthritis and tearing of the posterior horn of the medial meniscus, he is doing well after his most recent aspiration and injection.

## 2016-02-23 NOTE — Assessment & Plan Note (Signed)
Starting testosterone, recheck levels 1 week after his second injection, also adding CBC and PSA. He will return every 2 weeks for testosterone shots, follow this up with PCP.

## 2016-02-23 NOTE — Assessment & Plan Note (Signed)
Will discuss weight loss medication with PCP

## 2016-03-08 ENCOUNTER — Ambulatory Visit (INDEPENDENT_AMBULATORY_CARE_PROVIDER_SITE_OTHER): Payer: Managed Care, Other (non HMO) | Admitting: Sports Medicine

## 2016-03-08 VITALS — BP 124/72 | HR 78

## 2016-03-08 DIAGNOSIS — E291 Testicular hypofunction: Secondary | ICD-10-CM | POA: Diagnosis not present

## 2016-03-08 MED ORDER — TESTOSTERONE CYPIONATE 200 MG/ML IM SOLN
200.0000 mg | Freq: Once | INTRAMUSCULAR | Status: AC
  Administered 2016-03-08: 200 mg via INTRAMUSCULAR

## 2016-03-08 NOTE — Progress Notes (Signed)
Patient came into office today for testosterone injection. Denies chest pain, shortness of breath, headaches and problems associated with taking this medication. Patient states he has had no abnornal mood swings. Patient tolerated injection in [place] well without complications. Patient advised to schedule his next injection for 2 weeks from today.

## 2016-03-22 ENCOUNTER — Ambulatory Visit (INDEPENDENT_AMBULATORY_CARE_PROVIDER_SITE_OTHER): Payer: Managed Care, Other (non HMO) | Admitting: Sports Medicine

## 2016-03-22 VITALS — BP 130/77 | HR 74

## 2016-03-22 DIAGNOSIS — E291 Testicular hypofunction: Secondary | ICD-10-CM

## 2016-03-22 MED ORDER — TESTOSTERONE CYPIONATE 200 MG/ML IM SOLN
200.0000 mg | Freq: Once | INTRAMUSCULAR | Status: AC
Start: 2016-03-22 — End: 2016-03-22
  Administered 2016-03-22: 200 mg via INTRAMUSCULAR

## 2016-03-22 NOTE — Progress Notes (Signed)
Pt in for testosterone injection today.  Given RUOQ with no complications.  Pt would like to do his own injections & wants teaching at next visit.  Beatris Ship, [place]

## 2016-04-06 ENCOUNTER — Ambulatory Visit (INDEPENDENT_AMBULATORY_CARE_PROVIDER_SITE_OTHER): Payer: Managed Care, Other (non HMO) | Admitting: Sports Medicine

## 2016-04-06 VITALS — BP 144/79 | HR 59 | Wt 239.0 lb

## 2016-04-06 DIAGNOSIS — E291 Testicular hypofunction: Secondary | ICD-10-CM

## 2016-04-06 MED ORDER — TESTOSTERONE CYPIONATE 200 MG/ML IM SOLN
200.0000 mg | INTRAMUSCULAR | Status: DC
  Administered 2016-04-06: 200 mg via INTRAMUSCULAR

## 2016-04-06 NOTE — Progress Notes (Signed)
Patient is here for testosterone injection. Patient denies headache,vision changes, and mood changes. He states he does not feel as tired.  I did show the patient how to give himself testosterone injections.

## 2016-04-20 ENCOUNTER — Ambulatory Visit (INDEPENDENT_AMBULATORY_CARE_PROVIDER_SITE_OTHER): Payer: Managed Care, Other (non HMO) | Admitting: Physician Assistant

## 2016-04-20 VITALS — BP 140/85 | HR 71 | Temp 97.7°F | Resp 16 | Ht 74.0 in | Wt 238.0 lb

## 2016-04-20 DIAGNOSIS — E291 Testicular hypofunction: Secondary | ICD-10-CM

## 2016-04-20 MED ORDER — TESTOSTERONE CYPIONATE 200 MG/ML IM SOLN
200.0000 mg | INTRAMUSCULAR | Status: DC
  Administered 2016-04-20: 200 mg via INTRAMUSCULAR

## 2016-04-20 NOTE — Progress Notes (Signed)
Patient here for testosterone injection and to learn how to self administer. He denies chest pain, shortness of breath, headaches, mood changes.   First lesson on self administration of intramuscular injection given; unable to return demonstration on himself but wants to try again in 2 weeks.  Patient tolerated nurse administered injection well without complications. Advised him to make next appt in 14 days.

## 2016-05-04 ENCOUNTER — Ambulatory Visit: Payer: Managed Care, Other (non HMO)

## 2016-05-11 ENCOUNTER — Ambulatory Visit: Payer: Managed Care, Other (non HMO)

## 2017-06-20 DIAGNOSIS — G479 Sleep disorder, unspecified: Secondary | ICD-10-CM | POA: Insufficient documentation

## 2018-01-19 IMAGING — DX DG KNEE COMPLETE 4+V*L*
4 series · 4 of 4 positions shown · non-contrast
Comparison: None.

CLINICAL DATA: Knee pain for 3 months.  No trauma.

EXAM:
LEFT KNEE - COMPLETE 4+ VIEW

[knee ap]
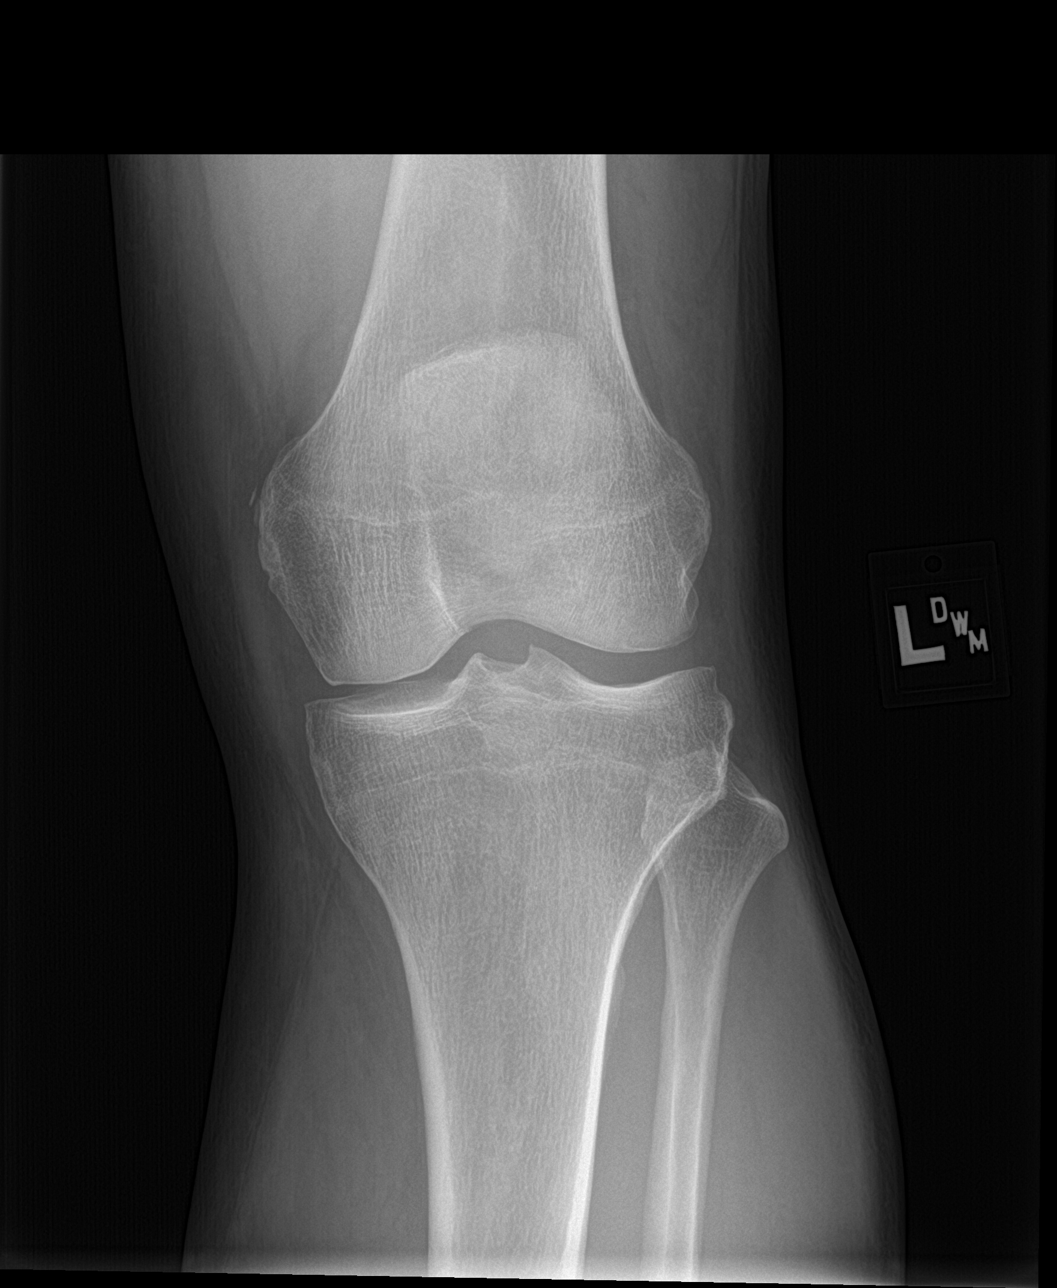

[tunnel]
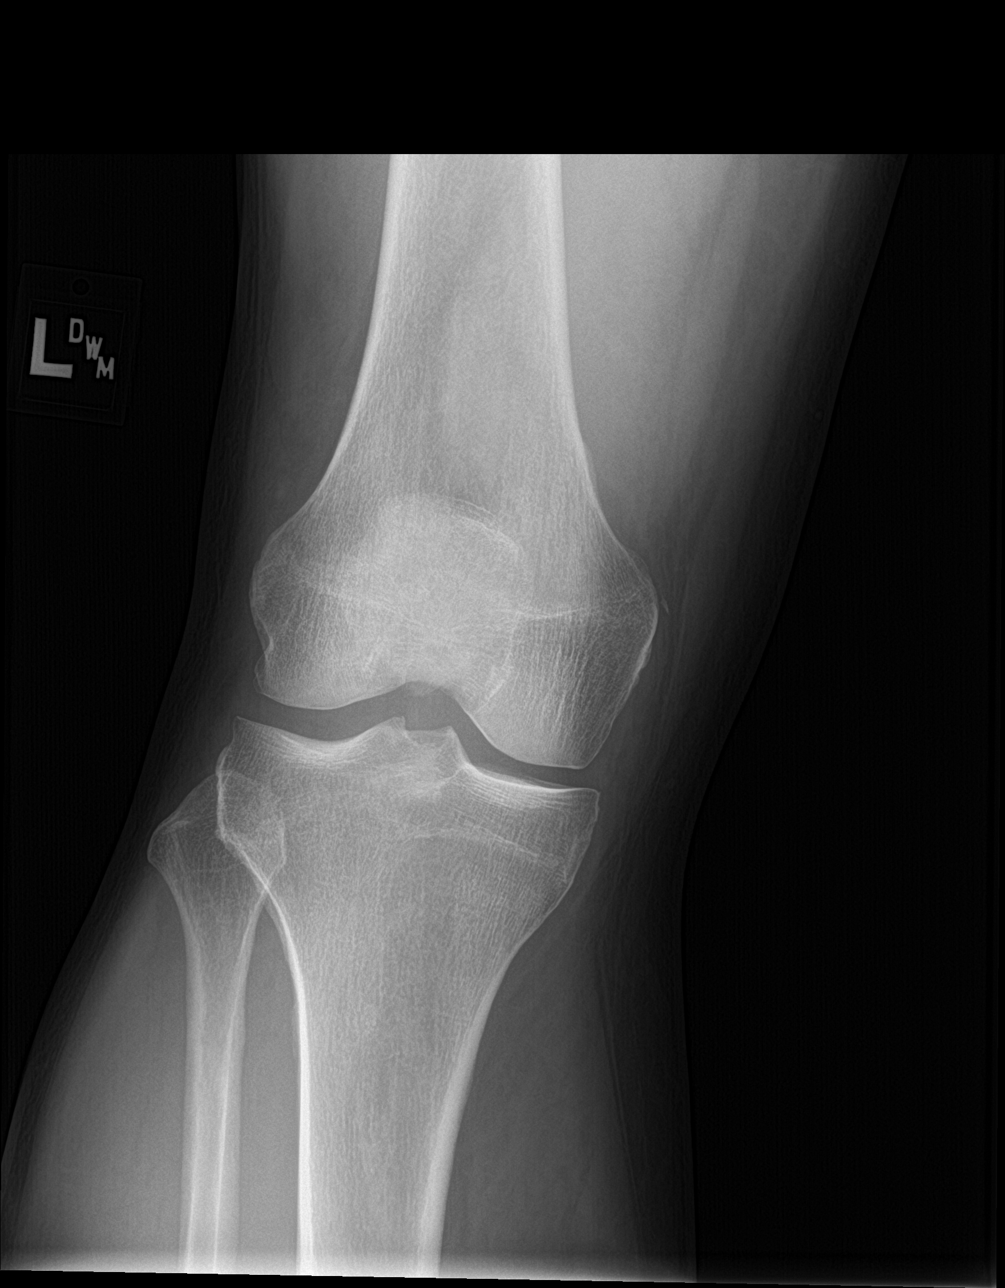

[knee lat]
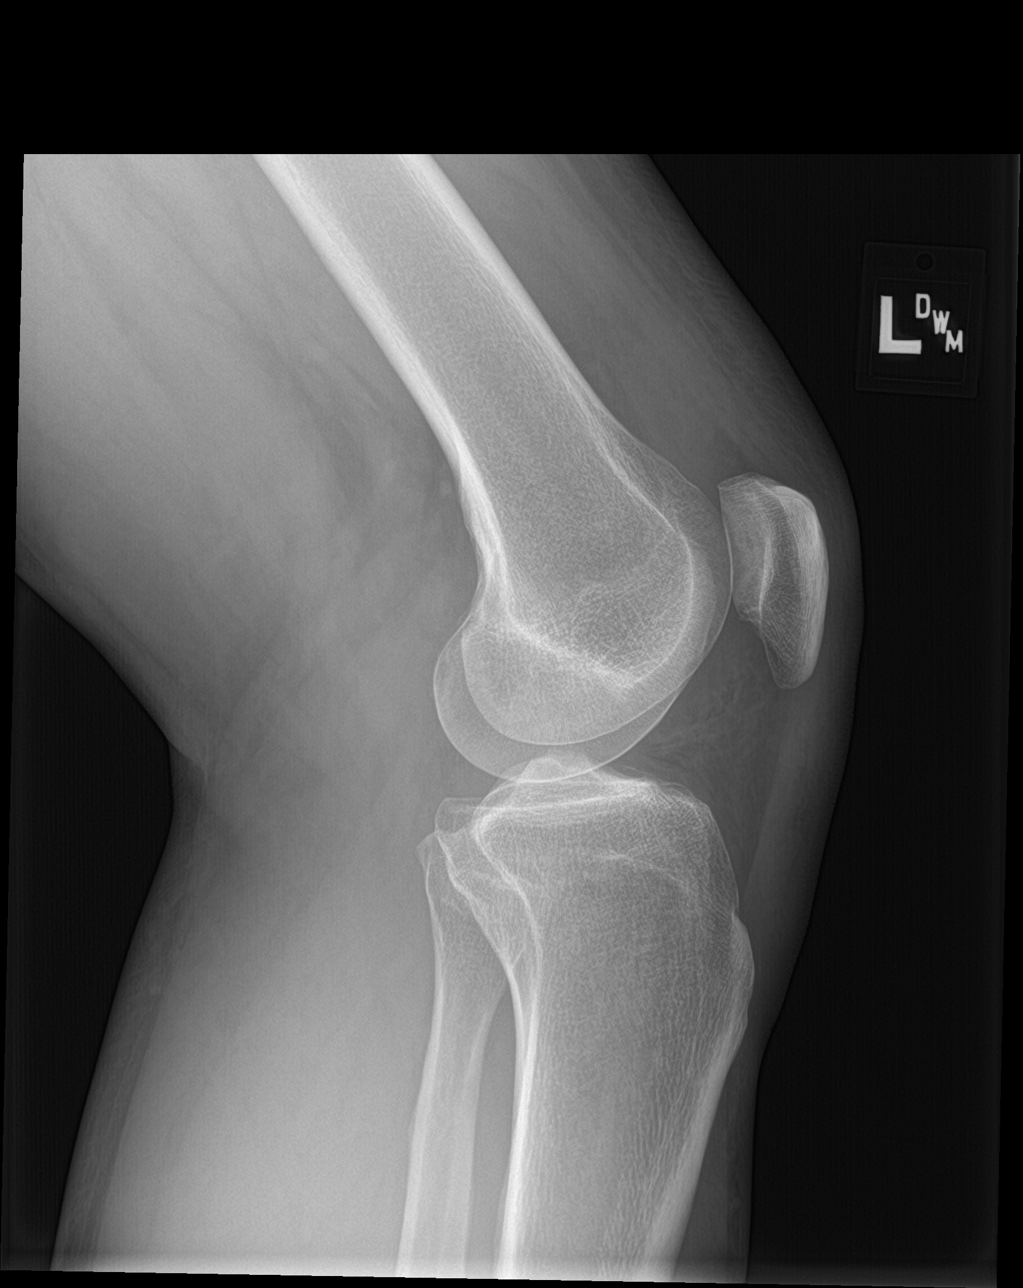

[knee sunrise]
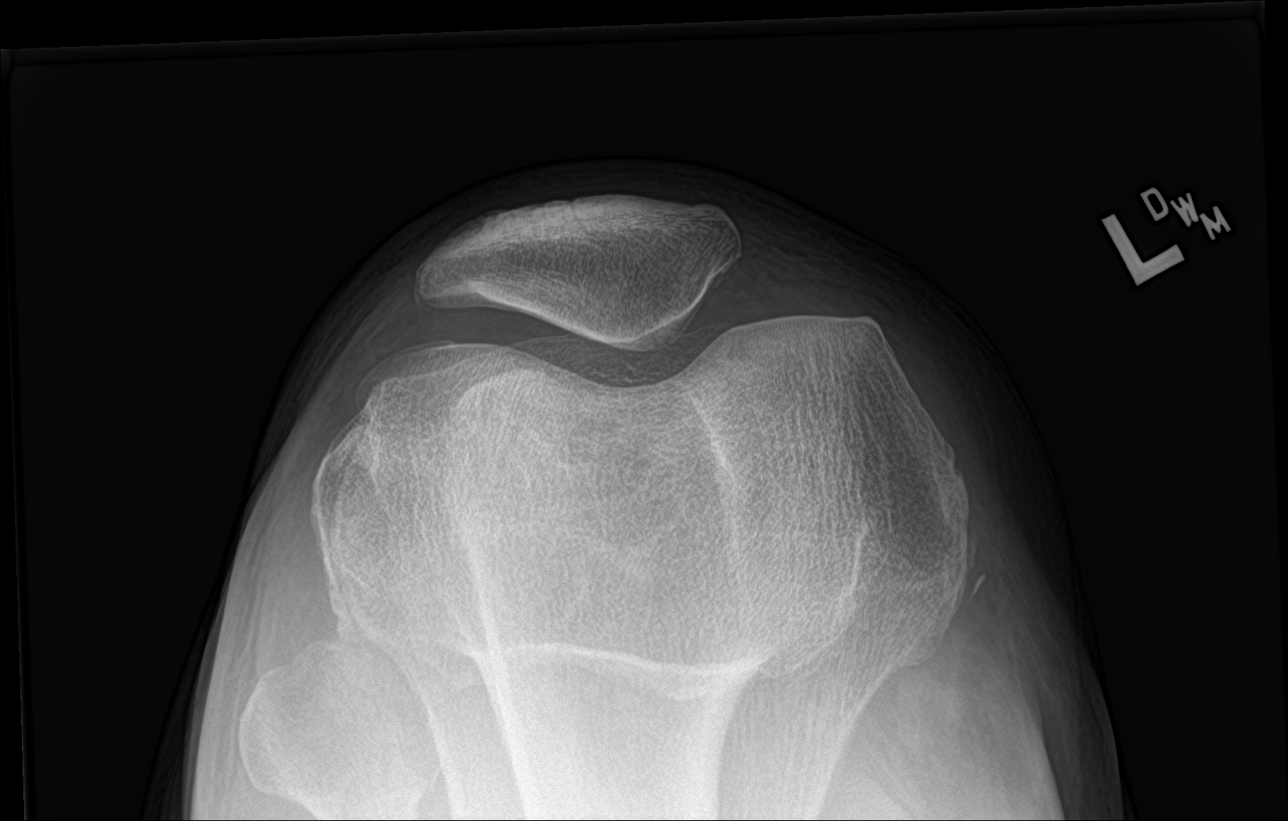

[4 of 4 positions shown; findings below may reference images not displayed]

FINDINGS: No acute fracture or dislocation. A tiny calcification adjacent to
the medial distal femur is consistent with a previous medial
collateral ligament injury. No acute abnormalities.
IMPRESSION: No acute abnormalities.

## 2018-11-04 ENCOUNTER — Encounter: Payer: Self-pay | Admitting: Physician Assistant

## 2018-11-04 ENCOUNTER — Ambulatory Visit (INDEPENDENT_AMBULATORY_CARE_PROVIDER_SITE_OTHER): Payer: Managed Care, Other (non HMO) | Admitting: Physician Assistant

## 2018-11-04 VITALS — BP 140/77 | HR 67 | Temp 98.4°F | Wt 248.0 lb

## 2018-11-04 DIAGNOSIS — Z13220 Encounter for screening for lipoid disorders: Secondary | ICD-10-CM

## 2018-11-04 DIAGNOSIS — Z1211 Encounter for screening for malignant neoplasm of colon: Secondary | ICD-10-CM | POA: Diagnosis not present

## 2018-11-04 DIAGNOSIS — Z0000 Encounter for general adult medical examination without abnormal findings: Secondary | ICD-10-CM | POA: Diagnosis not present

## 2018-11-04 DIAGNOSIS — Z125 Encounter for screening for malignant neoplasm of prostate: Secondary | ICD-10-CM

## 2018-11-04 DIAGNOSIS — M25561 Pain in right knee: Secondary | ICD-10-CM

## 2018-11-04 DIAGNOSIS — R030 Elevated blood-pressure reading, without diagnosis of hypertension: Secondary | ICD-10-CM

## 2018-11-04 DIAGNOSIS — Z131 Encounter for screening for diabetes mellitus: Secondary | ICD-10-CM

## 2018-11-04 DIAGNOSIS — Z23 Encounter for immunization: Secondary | ICD-10-CM | POA: Diagnosis not present

## 2018-11-04 MED ORDER — MELOXICAM 15 MG PO TABS: 15.0000 mg | ORAL_TABLET | Freq: Every day | ORAL | 3 refills | Status: DC

## 2018-11-04 NOTE — Patient Instructions (Addendum)
Health Maintenance, Male A healthy lifestyle and preventive care is important for your health and wellness. Ask your health care provider about what schedule of regular examinations is right for you. What should I know about weight and diet? Eat a Healthy Diet  Eat plenty of vegetables, fruits, whole grains, low-fat dairy products, and lean protein.  Do not eat a lot of foods high in solid fats, added sugars, or salt.  Maintain a Healthy Weight Regular exercise can help you achieve or maintain a healthy weight. You should:  Do at least 150 minutes of exercise each week. The exercise should increase your heart rate and make you sweat (moderate-intensity exercise).  Do strength-training exercises at least twice a week. Watch Your Levels of Cholesterol and Blood Lipids  Have your blood tested for lipids and cholesterol every 5 years starting at 52 years of age. If you are at high risk for heart disease, you should start having your blood tested when you are 52 years old. You may need to have your cholesterol levels checked more often if: ? Your lipid or cholesterol levels are high. ? You are older than 52 years of age. ? You are at high risk for heart disease. What should I know about cancer screening? Many types of cancers can be detected early and may often be prevented. Lung Cancer  You should be screened every year for lung cancer if: ? You are a current smoker who has smoked for at least 30 years. ? You are a former smoker who has quit within the past 15 years.  Talk to your health care provider about your screening options, when you should start screening, and how often you should be screened. Colorectal Cancer  Routine colorectal cancer screening usually begins at 52 years of age and should be repeated every 5-10 years until you are 52 years old. You may need to be screened more often if early forms of precancerous polyps or small growths are found. Your health care provider may  recommend screening at an earlier age if you have risk factors for colon cancer.  Your health care provider may recommend using home test kits to check for hidden blood in the stool.  A small camera at the end of a tube can be used to examine your colon (sigmoidoscopy or colonoscopy). This checks for the earliest forms of colorectal cancer. Prostate and Testicular Cancer  Depending on your age and overall health, your health care provider may do certain tests to screen for prostate and testicular cancer.  Talk to your health care provider about any symptoms or concerns you have about testicular or prostate cancer. Skin Cancer  Check your skin from head to toe regularly.  Tell your health care provider about any new moles or changes in moles, especially if: ? There is a change in a mole's size, shape, or color. ? You have a mole that is larger than a pencil eraser.  Always use sunscreen. Apply sunscreen liberally and repeat throughout the day.  Protect yourself by wearing long sleeves, pants, a wide-brimmed hat, and sunglasses when outside. What should I know about heart disease, diabetes, and high blood pressure?  If you are 18-39 years of age, have your blood pressure checked every 3-5 years. If you are 40 years of age or older, have your blood pressure checked every year. You should have your blood pressure measured twice-once when you are at a hospital or clinic, and once when you are not at a hospital   or clinic. Record the average of the two measurements. To check your blood pressure when you are not at a hospital or clinic, you can use: ? An automated blood pressure machine at a pharmacy. ? A home blood pressure monitor.  Talk to your health care provider about your target blood pressure.  If you are between 60-103 years old, ask your health care provider if you should take aspirin to prevent heart disease.  Have regular diabetes screenings by checking your fasting blood sugar  level. ? If you are at a normal weight and have a low risk for diabetes, have this test once every three years after the age of 76. ? If you are overweight and have a high risk for diabetes, consider being tested at a younger age or more often.  A one-time screening for abdominal aortic aneurysm (AAA) by ultrasound is recommended for men aged 74-75 years who are current or former smokers. What should I know about preventing infection? Hepatitis B If you have a higher risk for hepatitis B, you should be screened for this virus. Talk with your health care provider to find out if you are at risk for hepatitis B infection. Hepatitis C Blood testing is recommended for:  Everyone born from [birth year] through 1964.  Anyone with known risk factors for hepatitis C. Sexually Transmitted Diseases (STDs)  You should be screened each year for STDs including gonorrhea and chlamydia if: ? You are sexually active and are younger than 52 years of age. ? You are older than 52 years of age and your health care provider tells you that you are at risk for this type of infection. ? Your sexual activity has changed since you were last screened and you are at an increased risk for chlamydia or gonorrhea. Ask your health care provider if you are at risk.  Talk with your health care provider about whether you are at high risk of being infected with HIV. Your health care provider may recommend a prescription medicine to help prevent HIV infection. What else can I do?  Schedule regular health, dental, and eye exams.  Stay current with your vaccines (immunizations).  Do not use any tobacco products, such as cigarettes, chewing tobacco, and e-cigarettes. If you need help quitting, ask your health care provider.  Limit alcohol intake to no more than 2 drinks per day. One drink equals 12 ounces of beer, 5 ounces of wine, or 1 ounces of hard liquor.  Do not use street drugs.  Do not share needles.  Ask your health  care provider for help if you need support or information about quitting drugs.  Tell your health care provider if you often feel depressed.  Tell your health care provider if you have ever been abused or do not feel safe at home. This information is not intended to replace advice given to you by your health care provider. Make sure you discuss any questions you have with your health care provider. Document Released: 02/09/2008 Document Revised: 04/11/2016 Document Reviewed: 05/17/2015 Elsevier Interactive Patient Education  2019 [place].           Meniscus Tear, Phase I Rehab Ask your health care provider which exercises are safe for you. Do exercises exactly as told by your health care provider and adjust them as directed. It is normal to feel mild stretching, pulling, tightness, or discomfort as you do these exercises, but you should stop right away if you feel sudden pain or your pain gets worse.Do  not begin these exercises until told by your health care provider. Stretching and range of motion exercises These exercises warm up your muscles and joints and improve the movement and flexibility of your knee. These exercises also help to relieve pain and stiffness. Exercise A: Knee flexion, active  1. Lie on your back with both knees straight. If this causes back discomfort, bend your uninjured knee so your foot is flat on the floor. 2. Slowly slide your left / right heel back toward your buttocks until you feel a gentle stretch in the front of your knee or thigh. Stop if you have pain. 3. Hold for __________ seconds. 4. Slowly slide your left / right heel back to the starting position. Repeat __________ times. Complete this exercise __________ times a day. Exercise B: Knee extension, sitting  1. Sit with your left / right heel propped on a chair, a coffee table, or a footstool. Do not have anything under your knee to support it. 2. Allow your leg muscles to relax, letting gravity  straighten out your knee. You should feel a stretch behind your left / right knee. 3. If told by your health care provider, deepen the stretch by placing a __________ weight on your thigh, just above your kneecap. 4. Hold this position for __________ seconds. Repeat __________ times. Complete this stretch __________ times a day. Strengthening exercises These exercises build strength and endurance in your knee. Endurance is the ability to use your muscles for a long time, even after they get tired. Exercise C: Quadriceps, isometric  1. Lie on your back with your left / right leg extended and your other knee bent. Put a rolled towel or small pillow under your right/left knee if told by your health care provider. 2. Slowly tense the muscles in the front of your left / right thigh by pushing the back of your knee down. You should see your knee cap slide up toward your hip or see increased dimpling just above the knee. 3. For __________ seconds, keep the muscle as tight as you can without increasing your pain. 4. Relax the muscles slowly and completely. Repeat __________ times. Complete this exercise __________ times a day. Exercise D: Straight leg raises (quadriceps) 1. Lie on your back with your left / right leg extended and your other knee bent. 2. Tense the muscles in the front of your left / right thigh. You should see your kneecap slide up or see increased dimpling just above the knee. 3. Keep these muscles tight as you raise your leg 4-6 inches (10-15 cm) off the floor. 4. Hold this position for __________ seconds. 5. Keep these muscles tense as you lower your leg. 6. Relax the muscles slowly and completely. Repeat __________ times. Complete this exercise __________ times a day. Exercise E: Hamstring curls  1. On the floor or a bed, lie on your abdomen with your legs straight. Put a folded towel or small pillow under your left / right thigh, just above your kneecap. 2. Slowly bend your left  / right knee as far as you can without pain. Keep your hips flat against the floor or bed. 3. Hold this position for __________ seconds. 4. Slowly lower your leg to the starting position. Repeat __________ times. Complete this exercise __________ times a day. This information is not intended to replace advice given to you by your health care provider. Make sure you discuss any questions you have with your health care provider. Document Released: 08/27/1998 Document Revised: 04/17/2016  Document Reviewed: 06/19/2015 Elsevier Interactive Patient Education  [employer].

## 2018-11-04 NOTE — Progress Notes (Signed)
Subjective:    Patient ID: Marcus Henry, male    DOB: 04-04-1966, 53 y.o.   MRN: [id number]  HPI Pt is a 53 yo male who presents to the clinic for CPE.   He has not been seen in a while. He is doing pretty good.   He has had some right medial knee pain for last 2-3 months. Intermittent twinges of pain and feeling like knee is going to "give way". He does a lot of walking at work. No known injury. Worse as he does more walking.   .. Active Ambulatory Problems    Diagnosis Date Noted  . Primary osteoarthritis of left knee 01/20/2016  . Snoring 02/06/2016  . Apneic episode 02/06/2016  . SOB (shortness of breath) 02/06/2016  . Obesity 02/06/2016  . Deviated nasal septum, congenital 02/06/2016  . Vitamin D insufficiency 02/06/2016  . Elevated fasting glucose 02/06/2016  . Prediabetes 02/08/2016  . Male hypogonadism 02/08/2016  . Acute pain of right knee 11/05/2018  . Pre-hypertension 11/05/2018   Resolved Ambulatory Problems    Diagnosis Date Noted  . No energy 02/06/2016   Past Medical History:  Diagnosis Date  . Hypertension       Review of Systems  All other systems reviewed and are negative.      Objective:   Physical Exam BP 140/77   Pulse 67   Temp 98.4 F (36.9 C) (Oral)   Wt 248 lb (112.5 kg)   BMI 31.84 kg/m   General Appearance:    Alert, cooperative, no distress, appears stated age  Head:    Normocephalic, without obvious abnormality, atraumatic  Eyes:    PERRL, conjunctiva/corneas clear, EOM's intact, fundi    benign, both eyes       Ears:    Normal TM's and external ear canals, both ears  Nose:   Nares normal, septum midline, mucosa normal, no drainage    or sinus tenderness  Throat:   Lips, mucosa, and tongue normal; teeth and gums normal  Neck:   Supple, symmetrical, trachea midline, no adenopathy;       thyroid:  No enlargement/tenderness/nodules; no carotid   bruit or JVD  Back:     Symmetric, no curvature, ROM normal, no CVA tenderness   Lungs:     Clear to auscultation bilaterally, respirations unlabored  Chest wall:    No tenderness or deformity  Heart:    Regular rate and rhythm, S1 and S2 normal, no murmur, rub   or gallop  Abdomen:     Soft, non-tender, bowel sounds active all four quadrants,    no masses, no organomegaly        Extremities:   Extremities normal, atraumatic, no cyanosis or edema. Right knee NROM, 5/5 strength, pain with mcmurrays maneuver in medial knee, tenderness to palpation in the medial knee. No swelling, redness.   Pulses:   2+ and symmetric all extremities  Skin:   Skin color, texture, turgor normal, no rashes or lesions  Lymph nodes:   Cervical, supraclavicular, and axillary nodes normal  Neurologic:   CNII-XII intact. Normal strength, sensation and reflexes      throughout         Assessment & Plan:  .Marland KitchenDevian was seen today for annual exam.  Diagnoses and all orders for this visit:  Routine physical examination -     Lipid Panel w/reflex Direct LDL -     COMPLETE METABOLIC PANEL WITH GFR -     DG Knee Bilateral Standing  AP -     CBC with Differential/Platelet  Colon cancer screening -     Ambulatory referral to Gastroenterology  Acute pain of right knee -     DG Knee Bilateral Standing AP -     meloxicam (MOBIC) 15 MG tablet; Take 1 tablet (15 mg total) by mouth daily.  Prostate cancer screening -     PSA  Screening for diabetes mellitus -     COMPLETE METABOLIC PANEL WITH GFR  Screening for lipid disorders -     Lipid Panel w/reflex Direct LDL  Need for Tdap vaccination -     Tdap vaccine greater than or equal to 7yo IM  Need for shingles vaccine -     Varicella-zoster vaccine IM (Shingrix)  .Marland Kitchen Depression screen [facility] 2/9 11/05/2018  Decreased Interest 0  Down, Depressed, Hopeless 0  PHQ - 2 Score 0  Altered sleeping 0  Tired, decreased energy 0  Change in appetite 0  Feeling bad or failure about yourself  0  Trouble concentrating 0  Moving slowly or  fidgety/restless 0  Suicidal thoughts 0  PHQ-9 Score 0  Difficult doing work/chores Not difficult at all    .Marland KitchenStart a regular exercise program and make sure you are eating a healthy diet Try to eat 4 servings of dairy a day or take a calcium supplement (500mg  twice a day). Today given shingrix/Tdap.  Colonoscopy ordered.  Fasting labs done.  Prostate screening ordered.   Marland Kitchen.IPSS Questionnaire (AUA-7): Over the past month.   1)  How often have you had a sensation of not emptying your bladder completely after you finish urinating?  0 - Not at all  2)  How often have you had to urinate again less than two hours after you finished urinating? 0 - Not at all  3)  How often have you found you stopped and started again several times when you urinated?  0 - Not at all  4) How difficult have you found it to postpone urination?  0 - Not at all  5) How often have you had a weak urinary stream?  0 - Not at all  6) How often have you had to push or strain to begin urination?  0 - Not at all  7) How many times did you most typically get up to urinate from the time you went to bed until the time you got up in the morning?  0 - None  Total score:  0-7 mildly symptomatic   8-19 moderately symptomatic   20-35 severely symptomatic     BP borderline. Discussed lifestyle changes. Pt reports checking and in the 120-130 to 70-80s. Will continue to monitor.   Right knee pain likely meniscal injury. Will get xray. Consider knee exercises. Mobic. Ice. Supportive sleeve when doing a lot of walking. If no improvement consider steroid injection vs MRI. Follow up with sports medicine.

## 2018-11-05 ENCOUNTER — Encounter: Payer: Self-pay | Admitting: Physician Assistant

## 2018-11-05 DIAGNOSIS — E7800 Pure hypercholesterolemia, unspecified: Secondary | ICD-10-CM | POA: Insufficient documentation

## 2018-11-05 DIAGNOSIS — E785 Hyperlipidemia, unspecified: Secondary | ICD-10-CM | POA: Insufficient documentation

## 2018-11-05 DIAGNOSIS — R030 Elevated blood-pressure reading, without diagnosis of hypertension: Secondary | ICD-10-CM | POA: Insufficient documentation

## 2018-11-05 DIAGNOSIS — M25561 Pain in right knee: Secondary | ICD-10-CM | POA: Insufficient documentation

## 2018-11-05 LAB — COMPLETE METABOLIC PANEL WITH GFR
AG Ratio: 1.9 (calc) (ref 1.0–2.5)
ALBUMIN MSPROF: 4.4 g/dL (ref 3.6–5.1)
ALKALINE PHOSPHATASE (APISO): 54 U/L (ref 35–144)
ALT: 26 U/L (ref 9–46)
AST: 19 U/L (ref 10–35)
BUN: 15 mg/dL (ref 7–25)
CO2: 26 mmol/L (ref 20–32)
Calcium: 9.2 mg/dL (ref 8.6–10.3)
Chloride: 105 mmol/L (ref 98–110)
Creat: 1.03 mg/dL (ref 0.70–1.33)
GFR, EST AFRICAN AMERICAN: 97 mL/min/{1.73_m2} (ref >=60)
GFR, Est Non African American: 84 mL/min/{1.73_m2} (ref >=60)
GLOBULIN: 2.3 g/dL (ref 1.9–3.7)
GLUCOSE: 121 mg/dL (ref 65–139)
Potassium: 4 mmol/L (ref 3.5–5.3)
SODIUM: 139 mmol/L (ref 135–146)
TOTAL PROTEIN: 6.7 g/dL (ref 6.1–8.1)
Total Bilirubin: 0.6 mg/dL (ref 0.2–1.2)

## 2018-11-05 LAB — LIPID PANEL W/REFLEX DIRECT LDL
CHOLESTEROL: 208 mg/dL — AB (ref <200)
HDL: 45 mg/dL (ref >=40)
LDL CHOLESTEROL (CALC): 132 mg/dL — AB
Non-HDL Cholesterol (Calc): 163 mg/dL (calc) — ABNORMAL HIGH (ref <130)
Total CHOL/HDL Ratio: 4.6 (calc) (ref <5.0)
Triglycerides: 170 mg/dL — ABNORMAL HIGH (ref <150)

## 2018-11-05 LAB — CBC WITH DIFFERENTIAL/PLATELET
ABSOLUTE MONOCYTES: 588 {cells}/uL (ref 200–950)
BASOS ABS: 30 {cells}/uL (ref 0–200)
Basophils Relative: 0.5 %
Eosinophils Absolute: 102 cells/uL (ref 15–500)
Eosinophils Relative: 1.7 %
HEMATOCRIT: 45.7 % (ref 38.5–50.0)
Hemoglobin: 15.6 g/dL (ref 13.2–17.1)
LYMPHS ABS: 2364 {cells}/uL (ref 850–3900)
MCH: 31 pg (ref 27.0–33.0)
MCHC: 34.1 g/dL (ref 32.0–36.0)
MCV: 90.7 fL (ref 80.0–100.0)
MPV: 10.8 fL (ref 7.5–12.5)
Monocytes Relative: 9.8 %
NEUTROS PCT: 48.6 %
Neutro Abs: 2916 cells/uL (ref 1500–7800)
PLATELETS: 256 10*3/uL (ref 140–400)
RBC: 5.04 10*6/uL (ref 4.20–5.80)
RDW: 12.3 % (ref 11.0–15.0)
Total Lymphocyte: 39.4 %
WBC: 6 10*3/uL (ref 3.8–10.8)

## 2018-11-05 LAB — PSA: PSA: 0.7 ng/mL (ref <=4.0)

## 2018-11-05 NOTE — Progress Notes (Signed)
Call pt: kidney, liver, glucose look good. Prostate normal. Cholesterol LDL up some. HDL normal but could be better. TG a little elevated.  Watch fried fatty foods. Start fish oil 4000mg  daily.

## 2018-12-10 ENCOUNTER — Encounter: Payer: Self-pay | Admitting: Physician Assistant

## 2020-03-21 ENCOUNTER — Ambulatory Visit (INDEPENDENT_AMBULATORY_CARE_PROVIDER_SITE_OTHER): Payer: No Typology Code available for payment source

## 2020-03-21 ENCOUNTER — Ambulatory Visit (INDEPENDENT_AMBULATORY_CARE_PROVIDER_SITE_OTHER): Payer: No Typology Code available for payment source | Admitting: Sports Medicine

## 2020-03-21 ENCOUNTER — Other Ambulatory Visit: Payer: Self-pay

## 2020-03-21 ENCOUNTER — Encounter: Payer: Self-pay | Admitting: Sports Medicine

## 2020-03-21 DIAGNOSIS — M1712 Unilateral primary osteoarthritis, left knee: Secondary | ICD-10-CM

## 2020-03-21 MED ORDER — MELOXICAM 15 MG PO TABS: ORAL_TABLET | ORAL | 3 refills | Status: DC

## 2020-03-21 NOTE — Assessment & Plan Note (Signed)
This is a pleasant 54 year old male, I have seen him approximately 4 years ago for knee osteoarthritis, we injected his knee and he did well. He is now having a recurrence of pain in his left knee, medial joint line, no mechanical symptoms, we will start conservative treatment again, x-rays, meloxicam, home rehab exercises, return to see me in 1 month, injection if no better. Of note MRI in 2017 showed osteoarthritis and tearing of the posterior horn of the medial meniscus.

## 2020-03-21 NOTE — Progress Notes (Signed)
    Procedures performed today:    None.  Independent interpretation of notes and tests performed by another provider:   None.  Brief History, Exam, Impression, and Recommendations:    Marcus Henry is a pleasant 54yo male who comes in with complaints of left knee pain. It is worse in the morning but improves with movement. It huurts more on the medial aspect of the knee joint and has been worsening over time. He has had no acute injury. He had an injection about 4 years ago. MRI in 2017 demonstrated OA. This is most likely worsening OA. We are going to get an Xray of his knee and prescribe meloxicam. He will follow up in 4 weeks to reassess and determine if furhtery intervention is needed.   [name], MS3   ___________________________________________ Gwen Her. Dianah Field, M.D., ABFM., CAQSM. Primary Care and [place] Instructor of [place] of [facility] of Medicine

## 2020-04-18 ENCOUNTER — Ambulatory Visit (INDEPENDENT_AMBULATORY_CARE_PROVIDER_SITE_OTHER): Payer: No Typology Code available for payment source | Admitting: Sports Medicine

## 2020-04-18 DIAGNOSIS — M1712 Unilateral primary osteoarthritis, left knee: Secondary | ICD-10-CM

## 2020-04-18 NOTE — Assessment & Plan Note (Signed)
This is a very pleasant 54 year old male, we saw him for knee pain, started meloxicam, home rehab exercises, he returns pain-free, of note MRI in 2017 showed osteoarthritis and tearing of the posterior horn of the medial meniscus. Because he is doing so well he can return as needed.

## 2020-04-18 NOTE — Progress Notes (Signed)
    Procedures performed today:    None.  Independent interpretation of notes and tests performed by another provider:   None.  Brief History, Exam, Impression, and Recommendations:    Marcus Henry is a pleasant 54 yo male who presented for follow up for left knee pain. Xrays demonstrated mild degenerative changes. He says his knee pain is much better with meloxicam and PT. There is no further intervention needed at the current time. He can follow up as needed.   [name], MS3   ___________________________________________ Gwen Her. Dianah Field, M.D., ABFM., CAQSM. Primary Care and [place] Instructor of [place] of [facility] of Medicine

## 2020-06-22 ENCOUNTER — Other Ambulatory Visit: Payer: Self-pay

## 2020-06-22 ENCOUNTER — Ambulatory Visit (INDEPENDENT_AMBULATORY_CARE_PROVIDER_SITE_OTHER): Payer: No Typology Code available for payment source | Admitting: Physician Assistant

## 2020-06-22 ENCOUNTER — Ambulatory Visit: Payer: No Typology Code available for payment source | Admitting: Family Medicine

## 2020-06-22 ENCOUNTER — Encounter: Payer: Self-pay | Admitting: Physician Assistant

## 2020-06-22 ENCOUNTER — Ambulatory Visit (INDEPENDENT_AMBULATORY_CARE_PROVIDER_SITE_OTHER): Payer: No Typology Code available for payment source

## 2020-06-22 VITALS — BP 146/92 | HR 78 | Ht 74.0 in | Wt 248.0 lb

## 2020-06-22 DIAGNOSIS — R1032 Left lower quadrant pain: Secondary | ICD-10-CM | POA: Diagnosis not present

## 2020-06-22 DIAGNOSIS — D735 Infarction of spleen: Secondary | ICD-10-CM

## 2020-06-22 DIAGNOSIS — R198 Other specified symptoms and signs involving the digestive system and abdomen: Secondary | ICD-10-CM

## 2020-06-22 DIAGNOSIS — K5730 Diverticulosis of large intestine without perforation or abscess without bleeding: Secondary | ICD-10-CM | POA: Diagnosis not present

## 2020-06-22 DIAGNOSIS — K838 Other specified diseases of biliary tract: Secondary | ICD-10-CM

## 2020-06-22 DIAGNOSIS — K8080 Other cholelithiasis without obstruction: Secondary | ICD-10-CM | POA: Diagnosis not present

## 2020-06-22 DIAGNOSIS — K429 Umbilical hernia without obstruction or gangrene: Secondary | ICD-10-CM

## 2020-06-22 DIAGNOSIS — K760 Fatty (change of) liver, not elsewhere classified: Secondary | ICD-10-CM | POA: Diagnosis not present

## 2020-06-22 MED ORDER — IOHEXOL 300 MG/ML  SOLN
100.0000 mL | Freq: Once | INTRAMUSCULAR | Status: AC | PRN
  Administered 2020-06-22: 100 mL via INTRAVENOUS

## 2020-06-22 NOTE — Patient Instructions (Signed)
Diverticulitis  Diverticulitis is infection or inflammation of small pouches (diverticula) in the colon that form due to a condition called diverticulosis. Diverticula can trap stool (feces) and bacteria, causing infection and inflammation. Diverticulitis may cause severe stomach pain and diarrhea. It may lead to tissue damage in the colon that causes bleeding. The diverticula may also burst (rupture) and cause infected stool to enter other areas of the abdomen. Complications of diverticulitis can include:  Bleeding.  Severe infection.  Severe pain.  Rupture (perforation) of the colon.  Blockage (obstruction) of the colon. What are the causes? This condition is caused by stool becoming trapped in the diverticula, which allows bacteria to grow in the diverticula. This leads to inflammation and infection. What increases the risk? You are more likely to develop this condition if:  You have diverticulosis. The risk for diverticulosis increases if: ? You are overweight or obese. ? You use tobacco products. ? You do not get enough exercise.  You eat a diet that does not include enough fiber. High-fiber foods include fruits, vegetables, beans, nuts, and whole grains. What are the signs or symptoms? Symptoms of this condition may include:  Pain and tenderness in the abdomen. The pain is normally located on the left side of the abdomen, but it may occur in other areas.  Fever and chills.  Bloating.  Cramping.  Nausea.  Vomiting.  Changes in bowel routines.  Blood in your stool. How is this diagnosed? This condition is diagnosed based on:  Your medical history.  A physical exam.  Tests to make sure there is nothing else causing your condition. These tests may include: ? Blood tests. ? Urine tests. ? Imaging tests of the abdomen, including X-rays, ultrasounds, MRIs, or CT scans. How is this treated? Most cases of this condition are mild and can be treated at home.  Treatment may include:  Taking over-the-counter pain medicines.  Following a clear liquid diet.  Taking antibiotic medicines by mouth.  Rest. More severe cases may need to be treated at a hospital. Treatment may include:  Not eating or drinking.  Taking prescription pain medicine.  Receiving antibiotic medicines through an IV tube.  Receiving fluids and nutrition through an IV tube.  Surgery. When your condition is under control, your health care provider may recommend that you have a colonoscopy. This is an exam to look at the entire large intestine. During the exam, a lubricated, bendable tube is inserted into the anus and then passed into the rectum, colon, and other parts of the large intestine. A colonoscopy can show how severe your diverticula are and whether something else may be causing your symptoms. Follow these instructions at home: Medicines  Take over-the-counter and prescription medicines only as told by your health care provider. These include fiber supplements, probiotics, and stool softeners.  If you were prescribed an antibiotic medicine, take it as told by your health care provider. Do not stop taking the antibiotic even if you start to feel better.  Do not drive or use heavy machinery while taking prescription pain medicine. General instructions   Follow a full liquid diet or another diet as directed by your health care provider. After your symptoms improve, your health care provider may tell you to change your diet. He or she may recommend that you eat a diet that contains at least 25 g (25 grams) of fiber daily. Fiber makes it easier to pass stool. Healthy sources of fiber include: ? Berries. One cup contains 4-8 grams of   fiber. ? Beans or lentils. One half cup contains 5-8 grams of fiber. ? Green vegetables. One cup contains 4 grams of fiber.  Exercise for at least 30 minutes, 3 times each week. You should exercise hard enough to raise your heart rate and  break a sweat.  Keep all follow-up visits as told by your health care provider. This is important. You may need a colonoscopy. Contact a health care provider if:  Your pain does not improve.  You have a hard time drinking or eating food.  Your bowel movements do not return to normal. Get help right away if:  Your pain gets worse.  Your symptoms do not get better with treatment.  Your symptoms suddenly get worse.  You have a fever.  You vomit more than one time.  You have stools that are bloody, black, or tarry. Summary  Diverticulitis is infection or inflammation of small pouches (diverticula) in the colon that form due to a condition called diverticulosis. Diverticula can trap stool (feces) and bacteria, causing infection and inflammation.  You are at higher risk for this condition if you have diverticulosis and you eat a diet that does not include enough fiber.  Most cases of this condition are mild and can be treated at home. More severe cases may need to be treated at a hospital.  When your condition is under control, your health care provider may recommend that you have an exam called a colonoscopy. This exam can show how severe your diverticula are and whether something else may be causing your symptoms. This information is not intended to replace advice given to you by your health care provider. Make sure you discuss any questions you have with your health care provider. Document Revised: 07/26/2017 Document Reviewed: 09/15/2016 Elsevier Patient Education  2020 [employer]  

## 2020-06-22 NOTE — Progress Notes (Signed)
Called patient and discussed plan.Trying to get ASAP in with GI if I can will let them order if not will order MRCP. He is expecting phone call with either appt time for GI and/or MRCP.

## 2020-06-22 NOTE — Progress Notes (Signed)
   Subjective:    Patient ID: Marcus Henry, male    DOB: 07-Jul-1966, 54 y.o.   MRN: [id number]  HPI  Patient is a 54 year old male who presents to the clinic with left lower quadrant pain that started on Sunday.  He denies any history of symptoms like this.  He denies any nausea, vomiting, diarrhea, constipation.  He denies any shortness of breath.  Any movement makes it difficult and causes pain.  If he lays perfectly still he is not in pain.  If he moves at all he is in pain.  He would right the worst pain 7 or 8 out of 10.  He is not taking anything for this.  He denies any fever, chills, body aches.  He denies any flank pain or dysuria. Denies any injury or harm. He just got back from [place] to see family.   .. Active Ambulatory Problems    Diagnosis Date Noted  . Primary osteoarthritis of left knee 01/20/2016  . Snoring 02/06/2016  . Apneic episode 02/06/2016  . SOB (shortness of breath) 02/06/2016  . Obesity 02/06/2016  . Deviated nasal septum, congenital 02/06/2016  . Vitamin D insufficiency 02/06/2016  . Elevated fasting glucose 02/06/2016  . Prediabetes 02/08/2016  . Male hypogonadism 02/08/2016  . Acute pain of right knee 11/05/2018  . Pre-hypertension 11/05/2018  . Elevated LDL cholesterol level 11/05/2018  . Left lower quadrant guarding 06/22/2020   Resolved Ambulatory Problems    Diagnosis Date Noted  . No energy 02/06/2016   Past Medical History:  Diagnosis Date  . Hypertension       Review of Systems See HPI.     Objective:   Physical Exam Vitals reviewed.  Constitutional:      Appearance: He is well-developed. He is obese.  Cardiovascular:     Rate and Rhythm: Normal rate.  Pulmonary:     Effort: Pulmonary effort is normal.  Abdominal:     General: Bowel sounds are normal. There is distension.     Palpations: Abdomen is soft.     Tenderness: There is abdominal tenderness in the left lower quadrant. There is guarding. There is no right CVA  tenderness or left CVA tenderness. Negative signs include psoas sign.     Hernia: No hernia is present.  Neurological:     General: No focal deficit present.     Mental Status: He is alert.  Psychiatric:        Mood and Affect: Mood normal.           Assessment & Plan:  .Marland KitchenAmeen was seen today for abdominal pain.  Diagnoses and all orders for this visit:  Left lower quadrant abdominal pain -     CT Abdomen Pelvis W Contrast -     COMPLETE METABOLIC PANEL WITH GFR -     CBC with Differential/Platelet  Left lower quadrant guarding -     COMPLETE METABOLIC PANEL WITH GFR -     CBC with Differential/Platelet   Suspect diverticulitis will get CT stat and labs. No red flags on today exam. BP is a little elevated. Discussed with patient likely findings and treatment. Will call after STAT scan reported.

## 2020-06-22 NOTE — Addendum Note (Signed)
Addended by: Donella Stade on: 06/22/2020 04:01 PM   Modules accepted: Orders

## 2020-06-23 LAB — LIPASE: Lipase: 21 U/L (ref 7–60)

## 2020-06-23 NOTE — Progress Notes (Signed)
Pancreatic enzymes are normal.

## 2020-08-15 DIAGNOSIS — K8020 Calculus of gallbladder without cholecystitis without obstruction: Secondary | ICD-10-CM | POA: Insufficient documentation

## 2020-10-31 ENCOUNTER — Ambulatory Visit (INDEPENDENT_AMBULATORY_CARE_PROVIDER_SITE_OTHER): Payer: No Typology Code available for payment source

## 2020-10-31 ENCOUNTER — Ambulatory Visit (INDEPENDENT_AMBULATORY_CARE_PROVIDER_SITE_OTHER): Payer: No Typology Code available for payment source | Admitting: Physician Assistant

## 2020-10-31 ENCOUNTER — Other Ambulatory Visit: Payer: Self-pay

## 2020-10-31 VITALS — BP 137/83 | HR 93 | Ht 74.0 in | Wt 243.0 lb

## 2020-10-31 DIAGNOSIS — M79661 Pain in right lower leg: Secondary | ICD-10-CM | POA: Diagnosis not present

## 2020-10-31 DIAGNOSIS — Z09 Encounter for follow-up examination after completed treatment for conditions other than malignant neoplasm: Secondary | ICD-10-CM

## 2020-10-31 DIAGNOSIS — M25561 Pain in right knee: Secondary | ICD-10-CM

## 2020-10-31 MED ORDER — MELOXICAM 15 MG PO TABS: 15.0000 mg | ORAL_TABLET | Freq: Every day | ORAL | 0 refills | Status: DC

## 2020-10-31 MED ORDER — DICLOFENAC SODIUM 1 % EX GEL: 4.0000 g | Freq: Four times a day (QID) | CUTANEOUS | 11 refills | Status: DC

## 2020-10-31 NOTE — Patient Instructions (Signed)
Shin Splints Rehab Ask your health care provider which exercises are safe for you. Do exercises exactly as told by your health care provider and adjust them as directed. It is normal to feel mild stretching, pulling, tightness, or discomfort as you do these exercises. Stop right away if you feel sudden pain or your pain gets worse. Do not begin these exercises until told by your health care provider. Stretching and range-of-motion exercise This exercise warms up your muscles and joints and improves the movement and flexibility of your lower leg. This exercise also helps to relieve pain. Calf stretch, standing 1. Stand with the ball of your left / right foot on a step. The ball of your foot is on the walking surface, right under your toes. 2. Keep your other foot firmly on the same step. 3. Hold on to the wall, a railing, or a chair for balance. 4. Slowly lift your other foot, allowing your body weight to press your left / right heel down over the edge of the step. You should feel a stretch in your left / right calf. 5. Hold this position for __________ seconds. 6. Repeat this exercise with a slight bend in your left / right knee. Repeat __________ times with your left / right knee straight and __________ times with your left / right knee bent. Complete this exercise __________ times a day.   Strengthening exercises These exercises build strength and endurance in your lower leg. Endurance is the ability to use your muscles for a long time, even after they get tired. Dorsiflexion with band 1. Secure a rubber exercise band or tubing to a fixed object, such as a table leg or a pole. 2. Secure the other end of the band around your left / right foot. 3. Sit on the floor, facing the fixed object with your left / right leg extended. The band should be slightly tense when your foot is relaxed. 4. Slowly use your ankle muscles to pull your foot toward you (dorsiflexion). 5. Hold this position for __________  seconds. 6. Slowly release the tension in the band and return your foot to the starting position. Repeat __________ times. Complete this exercise __________ times a day.   Ankle eversion with band 1. Secure one end of a rubber exercise band or tubing to a fixed object, such as a table leg or a pole, that will stay in place when the band is pulled. 2. Loop the other end of the band around the middle of your left / right foot. 3. Sit on the floor, facing the fixed object. The band should be slightly tense when your foot is relaxed. 4. Make fists with your hands and put them between your knees. This will focus your strengthening at your ankle. 5. Leading with your little toe, slowly push your banded foot outward, away from your other leg (eversion). Make sure the band is positioned to resist the entire motion. 6. Hold this position for __________ seconds. 7. Control the tension in the band as you slowly return your foot to the starting position. Repeat __________ times. Complete this exercise __________ times a day. Ankle inversion with band 1. Secure one end of a rubber exercise band or tubing to a fixed object, such as a table leg or a pole, that will stay in place when the band is pulled. 2. Loop the other end of the band around your left / right foot, just below your toes. 3. Sit on the floor, facing the fixed  object. The band should be slightly tense when your foot is relaxed. 4. Make fists with your hands and put them between your knees. This will focus your strengthening at your ankle. 5. Leading with your big toe, slowly pull your banded foot inward, toward your other leg (inversion). Make sure the band is positioned to resist the entire motion. 6. Hold this position for __________ seconds. 7. Control the tension in the band as you slowly return your foot to the starting position. Repeat __________ times. Complete this exercise __________ times a day. Lateral walking with band This is an  exercise in which you walk sideways (lateral), with tension provided by an exercise band. 1. Stand in a long hallway. 2. Wrap a loop of exercise band around your legs, just above your knees. 3. Bend your knees gently and drop your hips down and back so your weight is over your heels. 4. Step to the side to move down the length of the hallway, keeping your toes pointed forward and keeping tension in the band. 5. Repeat, leading with your other leg. Repeat __________ times. Complete this exercise __________ times a day. Balance exercise This exercise will help improve your control of your foot and ankle when you are standing or walking. Single leg stance 1. Without wearing shoes, stand near a railing or in a doorway. You may hold on to the railing or door frame as needed. 2. Stand on your left / right foot. Keep your big toe down on the floor and try to keep your arch lifted. 3. If this exercise is too easy, you can try doing it with your eyes closed or while standing on a pillow. 4. Hold this position for __________ seconds. Repeat __________ times. Complete this exercise __________ times a day. This information is not intended to replace advice given to you by your health care provider. Make sure you discuss any questions you have with your health care provider. Document Revised: 12/05/2018 Document Reviewed: 06/24/2018 Elsevier Patient Education  2021 [place] Splints  Shin splints is a painful condition that is felt in the front or the side of your legs. The muscles, the cord-like structures that connect muscles to bones (tendons), and the thin layer of tissue that covers the shin bone get irritated (inflamed). It can be caused by activities or exercises that are intense. It may also be caused by sports that have sudden starts and stops. Follow these instructions at home: Medicines  Take over-the-counter and prescription medicines only as told by your doctor.  Do not drive  or use heavy machinery while taking prescription pain medicine. Injury care  If told, put ice on the painful area. Icing can help to relieve pain and swelling. ? Put ice in a plastic bag. ? Place a towel between your skin and the bag. ? Leave the ice on for 20 minutes, 2-3 times per day.  If told, put heat on the painful area. Do this before exercises, or as told by your doctor. Heat can help to relax your muscles. Use the heat source that your doctor recommends, such as a moist heat pack or a heating pad. ? Place a towel between your skin and the heat source. ? Leave the heat on for 20-30 minutes. ? Take off the heat if your skin gets bright red. This is important.  Massage, stretch, and strengthen the shin area.  Wear compression socks as told by your doctor.  Raise (elevate) your legs above the  level of your heart while you are sitting or lying down.      Activity  Rest as needed. Return to activity slowly as told by your doctor.  Do not use your shins to support your body weight when you start exercising again. Try cycling or swimming.  Stop running if you have pain.  Warm up before you exercise.  Run on a flat and firm surface, if possible.  Change the intensity of your exercise slowly.  Increase your running distance slowly. For example, if you are running 5 miles this week, you should only add  mile to your run next week. General instructions  Wear shoes that have good arch support and heel support. Your shoes should cushion and support you as you walk or run.  Change your athletic shoes every 6 months, or every 350-450 miles.  Keep all follow-up visits as told by your doctor. This is important. Contact a doctor if:  You do not feel better.  Your pain gets worse.  You have swelling in your lower leg that gets worse.  Your shin is red and feels warm. Get help right away if:  You have very bad pain.  You have trouble walking. Summary  Shin splints is a  painful condition that is felt in the front or the side of your legs.  Medicines, rest, and ice may help control pain.  Return to activity slowly as told by your doctor.  Make sure to call your doctor if your problems continue or get worse. This information is not intended to replace advice given to you by your health care provider. Make sure you discuss any questions you have with your health care provider. Document Revised: 10/22/2017 Document Reviewed: 09/02/2017 Elsevier Patient Education  2021 [employer].

## 2020-10-31 NOTE — Progress Notes (Signed)
2 week history Started in right foot, traveled up front of calf, now in right knee Stabbing pain Has NOT used heat/ice/OTC muscle rubs/patches/ibuprofen/etc Has had trouble with right knee in the past No injury Hard to work

## 2020-11-01 ENCOUNTER — Encounter: Payer: Self-pay | Admitting: Physician Assistant

## 2020-11-01 NOTE — Progress Notes (Signed)
Subjective:    Patient ID: Marcus Henry, male    DOB: [birth year], 55 y.o.   MRN: [id number]  HPI  Patient is a 55 year old obese male with history of osteoarthritis of left knee who presents to the clinic with right lower anterior leg pain and right lateral knee pain for the last 2 weeks.  Marcus Henry denies any injury.  The symptoms are getting progressively worse.  Marcus Henry does walk a lot at his job and walks upstairs.  Marcus Henry has never had anything like this before.  Marcus Henry has not tried anything to make better.  His symptoms are worse when Marcus Henry goes to stand up and lift his toes.  They are better once Marcus Henry gets up and starts walking.   .. Active Ambulatory Problems    Diagnosis Date Noted  . Primary osteoarthritis of left knee 01/20/2016  . Snoring 02/06/2016  . Apneic episode 02/06/2016  . SOB (shortness of breath) 02/06/2016  . Obesity 02/06/2016  . Deviated nasal septum, congenital 02/06/2016  . Vitamin D insufficiency 02/06/2016  . Elevated fasting glucose 02/06/2016  . Prediabetes 02/08/2016  . Male hypogonadism 02/08/2016  . Acute pain of right knee 11/05/2018  . Pre-hypertension 11/05/2018  . Elevated LDL cholesterol level 11/05/2018  . Left lower quadrant guarding 06/22/2020   Resolved Ambulatory Problems    Diagnosis Date Noted  . No energy 02/06/2016   Past Medical History:  Diagnosis Date  . Hypertension      Review of Systems See HPI.     Objective:   Physical Exam Vitals reviewed.  Constitutional:      Appearance: Normal appearance. Marcus Henry is obese.  Cardiovascular:     Rate and Rhythm: Normal rate.     Pulses: Normal pulses.  Musculoskeletal:     Right lower leg: No edema.     Left lower leg: No edema.     Comments: Right lower extremity:  Tenderness medial and lateral anterior tibia more proximal to knee than distal. Some lateral joint tenderness reported to palpation.  No effusion/swelling/warmth. Pain in anterior right shin with dorsiflexion worse than plantar  flexion.  No laxity of knee.  Negative mcmurrays.    Neurological:     General: No focal deficit present.     Mental Status: Marcus Henry is alert and oriented to person, place, and time.     Comments: Hard to bear all weight on right leg and has a bit of limp walking and standing from a seated position.   Psychiatric:        Mood and Affect: Mood normal.           Assessment & Plan:  .Marland KitchenSaketh was seen today for knee pain.  Diagnoses and all orders for this visit:  Pain in right shin -     DG Knee Complete 4 Views Right -     DG Knee 1-2 Views Left -     DG Tibia/Fibula Left -     DG Tibia/Fibula Right -     meloxicam (MOBIC) 15 MG tablet; Take 1 tablet (15 mg total) by mouth daily. -     diclofenac Sodium (VOLTAREN) 1 % GEL; Apply 4 g topically 4 (four) times daily. To affected joint.  Lateral knee pain, right -     DG Knee Complete 4 Views Right -     DG Knee 1-2 Views Left -     DG Tibia/Fibula Left -     DG Tibia/Fibula Right -     meloxicam (  MOBIC) 15 MG tablet; Take 1 tablet (15 mg total) by mouth daily. -     diclofenac Sodium (VOLTAREN) 1 % GEL; Apply 4 g topically 4 (four) times daily. To affected joint.   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  Will get imaging today of knees and tibia/fibula to compare.  HO given for exercises.  mobic to start for 2 weeks daily.  voltaren gel to use as needed.  Lots of icing.  Discussed getting off feet and resting. Written out of work for 2 days.  If not improving ortho/sports medicine referral and to consider a boot to help relieve pressure.   Follow up in 2 weeks.

## 2020-11-01 NOTE — Progress Notes (Signed)
Marcus Henry,   Xrays show no acute changes or fractures. This supports the medial tibial stress syndrome. Let me know after 1 week of mobic and gel along with ice and rest how you are doing!

## 2020-11-27 ENCOUNTER — Other Ambulatory Visit: Payer: Self-pay | Admitting: Physician Assistant

## 2020-11-27 DIAGNOSIS — M25561 Pain in right knee: Secondary | ICD-10-CM

## 2020-11-27 DIAGNOSIS — M79661 Pain in right lower leg: Secondary | ICD-10-CM

## 2021-03-28 DIAGNOSIS — D135 Benign neoplasm of extrahepatic bile ducts: Secondary | ICD-10-CM | POA: Insufficient documentation

## 2021-04-18 ENCOUNTER — Telehealth: Payer: Self-pay | Admitting: Physician Assistant

## 2021-04-18 NOTE — Telephone Encounter (Signed)
I feel confident I could maintain an unbiased approach to handing his A1C. Dr. Darene Lamer would essentially be taking on a new patient. Ill let Dr. Darene Lamer decide for himself but if he does not want me to manage and Dr. Darene Lamer says no then he should consider Dr. Zigmund Daniel, Bonita Quin for management as well.

## 2021-04-18 NOTE — Telephone Encounter (Signed)
I am kindly declining and I have no doubt in Jade's ability to be unbiased in her care of both patients.  She has been doing this a long time and is quite experienced in her old age.

## 2021-04-18 NOTE — Telephone Encounter (Addendum)
Sandy with [facility] called. Pt was seen in their office for Pre-anesthia for his pancreatic surgery on Sept 1. His A1c is high and they want him to see his pcp before his surgery on Sept 1. Luvenia Starch is patient's pcp but he wants to see Dr T instead because he and his wife recently went through a divorce and she is Jade's patient as well.  Please advise.  Sandy's telephone number is [phone] office hours 7:30-4:00.Thank you

## 2021-04-19 ENCOUNTER — Ambulatory Visit: Payer: No Typology Code available for payment source | Admitting: Physician Assistant

## 2021-04-21 ENCOUNTER — Ambulatory Visit (INDEPENDENT_AMBULATORY_CARE_PROVIDER_SITE_OTHER): Payer: No Typology Code available for payment source | Admitting: Physician Assistant

## 2021-04-21 ENCOUNTER — Telehealth: Payer: Self-pay | Admitting: Neurology

## 2021-04-21 ENCOUNTER — Other Ambulatory Visit: Payer: Self-pay

## 2021-04-21 VITALS — BP 137/97 | HR 90 | Ht 74.0 in | Wt 237.0 lb

## 2021-04-21 DIAGNOSIS — M21371 Foot drop, right foot: Secondary | ICD-10-CM | POA: Diagnosis not present

## 2021-04-21 DIAGNOSIS — D135 Benign neoplasm of extrahepatic bile ducts: Secondary | ICD-10-CM | POA: Diagnosis not present

## 2021-04-21 DIAGNOSIS — E1165 Type 2 diabetes mellitus with hyperglycemia: Secondary | ICD-10-CM

## 2021-04-21 MED ORDER — XIGDUO XR 10-1000 MG PO TB24: 1.0000 | ORAL_TABLET | Freq: Every day | ORAL | 2 refills | Status: DC

## 2021-04-21 NOTE — Telephone Encounter (Signed)
Called patient's surgeon  Maggie Font, MD   [address], [place] [address]   [phone] (Work)    Spoke with patient's nurse and let her know that patient declined insulin. They don't have a goal a1c, so they will discuss with patient and cancel his surgery for now (scheduled next week). They will contact patient directly on Monday. Will call with any further questions for [place].

## 2021-04-21 NOTE — Patient Instructions (Addendum)
Start xigduo daily.  Will send to neurology.  Diabetes Mellitus and Nutrition, Adult When you have diabetes, or diabetes mellitus, it is very important to have healthy eating habits because your blood sugar (glucose) levels are greatly affected by what you eat and drink. Eating healthy foods in the right amounts, at about the same times every day, can help you: Control your blood glucose. Lower your risk of heart disease. Improve your blood pressure. Reach or maintain a healthy weight. What can affect my meal plan? Every person with diabetes is different, and each person has different needs for a meal plan. Your health care provider may recommend that you work with a dietitian to make a meal plan that is best for you. Your meal plan may vary depending on factors such as: The calories you need. The medicines you take. Your weight. Your blood glucose, blood pressure, and cholesterol levels. Your activity level. Other health conditions you have, such as heart or kidney disease. How do carbohydrates affect me? Carbohydrates, also called carbs, affect your blood glucose level more than any other type of food. Eating carbs naturally raises the amount of glucose in your blood. Carb counting is a method for keeping track of how many carbs you eat. Counting carbs is important to keep your blood glucose at a healthy level,especially if you use insulin or take certain oral diabetes medicines. It is important to know how many carbs you can safely have in each meal. This is different for every person. Your dietitian can help you calculate how manycarbs you should have at each meal and for each snack. How does alcohol affect me? Alcohol can cause a sudden decrease in blood glucose (hypoglycemia), especially if you use insulin or take certain oral diabetes medicines. Hypoglycemia can be a life-threatening condition. Symptoms of hypoglycemia, such as sleepiness, dizziness, and confusion, are similar to  symptoms of having too much alcohol. Do not drink alcohol if: Your health care provider tells you not to drink. You are pregnant, may be pregnant, or are planning to become pregnant. If you drink alcohol: Do not drink on an empty stomach. Limit how much you use to: 0-1 drink a day for women. 0-2 drinks a day for men. Be aware of how much alcohol is in your drink. In the [place] one drink equals one 12 oz bottle of beer (355 mL), one 5 oz glass of wine (148 mL), or one 1 oz glass of hard liquor (44 mL). Keep yourself hydrated with water, diet soda, or unsweetened iced tea. Keep in mind that regular soda, juice, and other mixers may contain a lot of sugar and must be counted as carbs. What are tips for following this plan?  Reading food labels Start by checking the serving size on the "Nutrition Facts" label of packaged foods and drinks. The amount of calories, carbs, fats, and other nutrients listed on the label is based on one serving of the item. Many items contain more than one serving per package. Check the total grams (g) of carbs in one serving. You can calculate the number of servings of carbs in one serving by dividing the total carbs by 15. For example, if a food has 30 g of total carbs per serving, it would be equal to 2 servings of carbs. Check the number of grams (g) of saturated fats and trans fats in one serving. Choose foods that have a low amount or none of these fats. Check the number of milligrams (mg) of salt (  sodium) in one serving. Most people should limit total sodium intake to less than 2,300 mg per day. Always check the nutrition information of foods labeled as "low-fat" or "nonfat." These foods may be higher in added sugar or refined carbs and should be avoided. Talk to your dietitian to identify your daily goals for nutrients listed on the label. Shopping Avoid buying canned, pre-made, or processed foods. These foods tend to be high in fat, sodium, and added sugar. Shop  around the outside edge of the grocery store. This is where you will most often find fresh fruits and vegetables, bulk grains, fresh meats, and fresh dairy. Cooking Use low-heat cooking methods, such as baking, instead of high-heat cooking methods like deep frying. Cook using healthy oils, such as olive, canola, or sunflower oil. Avoid cooking with butter, cream, or high-fat meats. Meal planning Eat meals and snacks regularly, preferably at the same times every day. Avoid going long periods of time without eating. Eat foods that are high in fiber, such as fresh fruits, vegetables, beans, and whole grains. Talk with your dietitian about how many servings of carbs you can eat at each meal. Eat 4-6 oz (112-168 g) of lean protein each day, such as lean meat, chicken, fish, eggs, or tofu. One ounce (oz) of lean protein is equal to: 1 oz (28 g) of meat, chicken, or fish. 1 egg.  cup (62 g) of tofu. Eat some foods each day that contain healthy fats, such as avocado, nuts, seeds, and fish. What foods should I eat? Fruits Berries. Apples. Oranges. Peaches. Apricots. Plums. Grapes. Mango. Papaya.Pomegranate. Kiwi. Cherries. Vegetables Lettuce. Spinach. Leafy greens, including kale, chard, collard greens, and mustard greens. Beets. Cauliflower. Cabbage. Broccoli. Carrots. Green beans.Tomatoes. Peppers. Onions. Cucumbers. Brussels sprouts. Grains Whole grains, such as whole-wheat or whole-grain bread, crackers, tortillas,cereal, and pasta. Unsweetened oatmeal. Quinoa. Brown or wild rice. Meats and other proteins Seafood. Poultry without skin. Lean cuts of poultry and beef. Tofu. Nuts. Seeds. Dairy Low-fat or fat-free dairy products such as milk, yogurt, and cheese. The items listed above may not be a complete list of foods and beverages you can eat. Contact a dietitian for more information. What foods should I avoid? Fruits Fruits canned with syrup. Vegetables Canned vegetables. Frozen vegetables  with butter or cream sauce. Grains Refined white flour and flour products such as bread, pasta, snack foods, andcereals. Avoid all processed foods. Meats and other proteins Fatty cuts of meat. Poultry with skin. Breaded or fried meats. Processed meat.Avoid saturated fats. Dairy Full-fat yogurt, cheese, or milk. Beverages Sweetened drinks, such as soda or iced tea. The items listed above may not be a complete list of foods and beverages you should avoid. Contact a dietitian for more information. Questions to ask a health care provider Do I need to meet with a diabetes educator? Do I need to meet with a dietitian? What number can I call if I have questions? When are the best times to check my blood glucose? Where to find more information: American Diabetes Association: diabetes.org Academy of Nutrition and Dietetics: www.eatright.[employer] of Diabetes and Digestive and Kidney Diseases: [employer] Association of Diabetes Care and Education Specialists: www.diabeteseducator.org Summary It is important to have healthy eating habits because your blood sugar (glucose) levels are greatly affected by what you eat and drink. A healthy meal plan will help you control your blood glucose and maintain a healthy lifestyle. Your health care provider may recommend that you work with a dietitian to make a  meal plan that is best for you. Keep in mind that carbohydrates (carbs) and alcohol have immediate effects on your blood glucose levels. It is important to count carbs and to use alcohol carefully. This information is not intended to replace advice given to you by your health care provider. Make sure you discuss any questions you have with your healthcare provider. Document Revised: 07/21/2019 Document Reviewed: 07/21/2019 Elsevier Patient Education  2021 [employer].

## 2021-04-21 NOTE — Progress Notes (Addendum)
Subjective:    Patient ID: Marcus Henry, male    DOB: [birth year], 55 y.o.   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 Pt is alone and polish some language understanding difficulties.   He is here because he was made to by surgeon. His only concern is his right foot that he is having trouble dorsiflexing. He noticed this for the last 2 weeks. Denies any injury. He states it feels numb but no pain. Denies any leg or back pain.   He admits his diet is not good. He drinks monster drinks a ginger ale throughout the day.   January had laparoscopic cholecystectomy.   .. Active Ambulatory Problems    Diagnosis Date Noted   Primary osteoarthritis of left knee 01/20/2016   Snoring 02/06/2016   Apneic episode 02/06/2016   SOB (shortness of breath) 02/06/2016   Obesity 02/06/2016   Deviated nasal septum, congenital 02/06/2016   Vitamin D insufficiency 02/06/2016   Elevated fasting glucose 02/06/2016   Prediabetes 02/08/2016   Male hypogonadism 02/08/2016   Acute pain of right knee 11/05/2018   Pre-hypertension 11/05/2018   Elevated LDL cholesterol level 11/05/2018   Left lower quadrant guarding 06/22/2020   Uncontrolled type 2 diabetes mellitus with hyperglycemia ([place]) 04/21/2021   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     Objective:   Physical Exam Vitals reviewed.  Constitutional:      Appearance: Normal appearance. He is obese.  HENT:     Head: Normocephalic.  Cardiovascular:     Rate and Rhythm: Normal rate and regular rhythm.  Pulmonary:     Effort: Pulmonary effort  is normal.     Breath sounds: Normal breath sounds.  Musculoskeletal:     Comments: Right foot: No dorsiflexion passive, active, resisted.  Plantar flexion intact.   Neurological:     General: No focal deficit present.     Mental Status: He is alert and oriented to person, place, and time.  Psychiatric:        Mood and Affect: Mood normal.          Assessment & Plan:   .Marland KitchenTaishi was seen today for diabetes.  Diagnoses and all orders for this visit:  Uncontrolled type 2 diabetes mellitus with hyperglycemia (HCC) -     Dapagliflozin-metFORMIN HCl ER (XIGDUO XR) 05-999 MG TB24; Take 1 tablet by mouth daily.  Right foot drop -     Ambulatory referral to Neurology  Ampullary adenoma  Discussed with patient Diabetes.  Discussed diet changes and regular exercise.  He is drinking a lot of sugar. This could have a good effect on A1C but I do not think it will get [place] to goal.  Discussed ultimate goal of under 6.5.  I am not sure of surgeons goal for surgery. Will call and see if we can figure out.  Explained he does need surgery sooner than later.  I wanted to start long acting insulin/GLP/orals. Pt declined injections. I discussed this may postpone surgery.  He only agreed to one  combination pill. Xigduo started.  Discussed side effect.  Pt got glucometer. Start checking sugars in mornings. Goal 90-130.  Discussed statin to lower cholesterol. Declined.  BP not to goal but was at pre-op.  Next A1C in 3 months.  Follow up 1 month with glucose logs to see improvement.  HO given.   He asks that I call and speak to his step-daughter the plan. Called and LM.   Drop foot for 2 weeks. Discussed numerous causes of this. He does not report injury and no low back pain. Referral made to neurology.   Spent 40 minutes with patient discussed medications, treatments, risk and goals.

## 2021-04-24 ENCOUNTER — Encounter: Payer: Self-pay | Admitting: Physician Assistant

## 2021-04-24 ENCOUNTER — Other Ambulatory Visit: Payer: Self-pay | Admitting: Neurology

## 2021-04-24 DIAGNOSIS — M25561 Pain in right knee: Secondary | ICD-10-CM

## 2021-04-24 DIAGNOSIS — M79661 Pain in right lower leg: Secondary | ICD-10-CM

## 2021-04-24 MED ORDER — MELOXICAM 15 MG PO TABS: 15.0000 mg | ORAL_TABLET | Freq: Every day | ORAL | 0 refills | Status: DC

## 2021-04-24 NOTE — Progress Notes (Signed)
April 21, 2021 Me     4:22 PM Note Called patient's surgeon   Maggie Font, MD   [address] [place], [place] [address]   [phone] (Work)    Spoke with patient's nurse and let her know that patient declined insulin. They don't have a goal a1c, so they will discuss with patient and cancel his surgery for now (scheduled next week). They will contact patient directly on Monday. Will call with any further questions for [place].

## 2021-04-26 ENCOUNTER — Encounter: Payer: Self-pay | Admitting: Family Medicine

## 2021-05-12 NOTE — Telephone Encounter (Signed)
Lvm to call the office

## 2021-05-12 NOTE — Telephone Encounter (Signed)
Oh ok, thank you!

## 2021-05-12 NOTE — Telephone Encounter (Signed)
Mardene Celeste- I have already seen him once after this message. He has a follow up also.

## 2021-06-08 ENCOUNTER — Encounter: Payer: Self-pay | Admitting: Neurology

## 2021-06-08 ENCOUNTER — Ambulatory Visit (INDEPENDENT_AMBULATORY_CARE_PROVIDER_SITE_OTHER): Payer: No Typology Code available for payment source | Admitting: Neurology

## 2021-06-08 VITALS — BP 134/79 | HR 79 | Ht 74.0 in | Wt 236.5 lb

## 2021-06-08 DIAGNOSIS — M21371 Foot drop, right foot: Secondary | ICD-10-CM

## 2021-06-08 NOTE — Patient Instructions (Addendum)
MRI Lumbar spine  EMG/NCS  Physical therapy  Return 3 months

## 2021-06-08 NOTE — Progress Notes (Signed)
[facility]  PATIENT: Marcus Henry DOB: [birth year]  REFERRING CLINICIAN: Donella Stade, PA-C HISTORY FROM: Patient  REASON FOR VISIT: Right foot drop    HISTORICAL  CHIEF COMPLAINT:  Chief Complaint  Patient presents with   New Patient (Initial Visit)    Room 13. Pt developed right foot drop about 2 months ago.     HISTORY OF PRESENT ILLNESS:  This is a 55 year old gentleman with past medical history of diabetes mellitus, hyperlipidemia who is presenting with complaint of right foot drop for the past 2 months.  Patient states the he initially had pain in the right shin started about 20-month ago was diagnosed with shin splint, put on medication then couple weeks later he noted the right foot weakness.  He denies any injury to his back or to his knee, denies currently any pain, denies any numbness or tingling.  He stated he still able to ambulate and even run, denies any falls.  The only symptom that he has now he mentioned that he cannot dorsiflex his foot or evert his foot    OTHER MEDICAL CONDITIONS: Diabetes Mellitus, Hyperlipidemia    REVIEW OF SYSTEMS: Full 14 system review of systems performed and negative with exception of: as noted in the HPI  ALLERGIES: No Known Allergies  HOME MEDICATIONS: Outpatient Medications Prior to Visit  Medication Sig Dispense Refill   Dapagliflozin-metFORMIN HCl ER (XIGDUO XR) 05-999 MG TB24 Take 1 tablet by mouth daily. 30 tablet 2   diclofenac Sodium (VOLTAREN) 1 % GEL Apply 4 g topically 4 (four) times daily. To affected joint. (Patient not taking: Reported on 06/08/2021) 100 g 11   meloxicam (MOBIC) 15 MG tablet Take 1 tablet (15 mg total) by mouth daily. appt for refills (Patient not taking: Reported on 06/08/2021) 90 tablet 0   No facility-administered medications prior to visit.    PAST MEDICAL HISTORY: Past Medical History:  Diagnosis Date   Hypertension    borderline    PAST SURGICAL  HISTORY: History reviewed. No pertinent surgical history.  FAMILY HISTORY: History reviewed. No pertinent family history.  SOCIAL HISTORY: Social History   Socioeconomic History   Marital status: Married    Spouse name: Not on file   Number of children: Not on file   Years of education: Not on file   Highest education level: Not on file  Occupational History   Not on file  Tobacco Use   Smoking status: Former    Types: Cigarettes   Smokeless tobacco: Never  Substance and Sexual Activity   Alcohol use: Yes   Drug use: No   Sexual activity: Yes  Other Topics Concern   Not on file  Social History Narrative   Not on file   Social Determinants of Health   Financial Resource Strain: Not on file  Food Insecurity: Not on file  Transportation Needs: Not on file  Physical Activity: Not on file  Stress: Not on file  Social Connections: Not on file  Intimate Partner Violence: Not on file     PHYSICAL EXAM  GENERAL EXAM/CONSTITUTIONAL: Vitals:  Vitals:   06/08/21 1411  BP: 134/79  Pulse: 79  Weight: 236 lb 8 oz (107.3 kg)  Height: 6\' 2"  (1.88 m)   Body mass index is 30.36 kg/m. Wt Readings from Last 3 Encounters:  06/08/21 236 lb 8 oz (107.3 kg)  04/21/21 237 lb (107.5 kg)  10/31/20 243 lb (110.2 kg)   Patient is in no distress; well developed, nourished  and groomed; neck is supple  EYES: Pupils round and reactive to light, Visual fields full to confrontation, Extraocular movements intacts,   MUSCULOSKELETAL: Gait, strength, tone, movements noted in Neurologic exam below  NEUROLOGIC: MENTAL STATUS:  No flowsheet data found. awake, alert, oriented to person, place and time recent and remote memory intact normal attention and concentration language fluent, comprehension intact, naming intact fund of knowledge appropriate  CRANIAL NERVE:  2nd, 3rd, 4th, 6th - pupils equal and reactive to light, visual fields full to confrontation, extraocular muscles  intact, no nystagmus 5th - facial sensation symmetric 7th - facial strength symmetric 8th - hearing intact 9th - palate elevates symmetrically, uvula midline 11th - shoulder shrug symmetric 12th - tongue protrusion midline  MOTOR:  normal bulk and tone, full strength in the BUE, and LLE.  In the RLE: Hip flexion 5/5, knee flexion/extension 5/5, foot plantar flexion 5/5, dorsiflexion 1-2/5, foot inversion 5/5 and eversion 2/5  SENSORY:  normal and symmetric to light touch, pinprick, temperature, vibration  COORDINATION:  finger-nose-finger, fine finger movements normal  REFLEXES:  deep tendon reflexes present and symmetric except for an absent right ankle jerk   GAIT/STATION:  High steppage gait      DIAGNOSTIC DATA (LABS, IMAGING, TESTING) - I reviewed patient records, labs, notes, testing and imaging myself where available.  Lab Results  Component Value Date   WBC 6.0 11/04/2018   HGB 15.6 11/04/2018   HCT 45.7 11/04/2018   MCV 90.7 11/04/2018   PLT 256 11/04/2018      Component Value Date/Time   NA 139 11/04/2018 1612   K 4.0 11/04/2018 1612   CL 105 11/04/2018 1612   CO2 26 11/04/2018 1612   GLUCOSE 121 11/04/2018 1612   BUN 15 11/04/2018 1612   CREATININE 1.03 11/04/2018 1612   CALCIUM 9.2 11/04/2018 1612   PROT 6.7 11/04/2018 1612   ALBUMIN 4.1 02/03/2016 1546   AST 19 11/04/2018 1612   ALT 26 11/04/2018 1612   ALKPHOS 49 02/03/2016 1546   BILITOT 0.6 11/04/2018 1612   GFRNONAA 84 11/04/2018 1612   GFRAA 97 11/04/2018 1612   Lab Results  Component Value Date   CHOL 208 (H) 11/04/2018   HDL 45 11/04/2018   LDLCALC 132 (H) 11/04/2018   TRIG 170 (H) 11/04/2018   CHOLHDL 4.6 11/04/2018   Lab Results  Component Value Date   HGBA1C 6.3 (H) 02/03/2016   Lab Results  Component Value Date   VITAMINB12 1,150 (H) 02/03/2016   Lab Results  Component Value Date   TSH 0.74 02/03/2016     ASSESSMENT AND PLAN  55 y.o. year old male with past  medical history of diabetes mellitus and hyperlipidemia presenting with complaint of right foot drop in the past 2 months.  Patient foot drop etiology likely compression of the peroneal nerve as he denies any history of lumbar trauma or any trauma.  On exam he is noted to have weakness in dorsiflexion and eversion of the right foot.  I will order a nerve conduction study to localize the site of compression of the nerve, physical therapy to strengthen the dorsiflexion muscles, provide braces,  and also lumbar spine MRI for completion.  I will contact the patient to go over the results otherwise I will see him in 3 months for follow-up   1. Right foot drop     PLAN: MRI Lumbar spine  EMG/NCS  Physical therapy  Return 3 months   Orders Placed This Encounter  Procedures   MR LUMBAR SPINE WO CONTRAST   Ambulatory referral to Physical Therapy   NCV with EMG(electromyography)     No orders of the defined types were placed in this encounter.   Return in about 3 months (around 09/08/2021).    Alric Ran, MD 06/08/2021, 2:42 PM  [facility] [address], [place], [place] [address] [phone]

## 2021-06-20 ENCOUNTER — Ambulatory Visit
Admission: RE | Admit: 2021-06-20 | Discharge: 2021-06-20 | Disposition: A | Discharge status: Home / Self Care | Payer: No Typology Code available for payment source | Source: Ambulatory Visit | Attending: Neurology | Admitting: Neurology

## 2021-06-20 ENCOUNTER — Other Ambulatory Visit: Payer: Self-pay

## 2021-06-20 DIAGNOSIS — M21371 Foot drop, right foot: Secondary | ICD-10-CM

## 2021-06-30 ENCOUNTER — Telehealth: Payer: Self-pay | Admitting: Neurology

## 2021-06-30 NOTE — Telephone Encounter (Signed)
LVM for patient to schedule NCS/EMG on 10/17 and we haven't heard back from him, so I sent a mychart message asking him to call the office so we can get him scheduled.

## 2021-07-11 ENCOUNTER — Other Ambulatory Visit: Payer: Self-pay

## 2021-07-11 ENCOUNTER — Ambulatory Visit: Payer: No Typology Code available for payment source | Attending: Neurology | Admitting: Physical Therapy

## 2021-07-11 DIAGNOSIS — M21371 Foot drop, right foot: Secondary | ICD-10-CM | POA: Diagnosis present

## 2021-07-11 DIAGNOSIS — M6281 Muscle weakness (generalized): Secondary | ICD-10-CM | POA: Diagnosis present

## 2021-07-11 DIAGNOSIS — M25671 Stiffness of right ankle, not elsewhere classified: Secondary | ICD-10-CM

## 2021-07-11 DIAGNOSIS — R262 Difficulty in walking, not elsewhere classified: Secondary | ICD-10-CM

## 2021-07-11 DIAGNOSIS — R2689 Other abnormalities of gait and mobility: Secondary | ICD-10-CM | POA: Diagnosis present

## 2021-07-11 NOTE — Patient Instructions (Addendum)
    Access Code: [email or web address] URL: [email or web address] Date: 07/11/2021 Prepared by: Annie Paras  Exercises Long Sitting Calf Stretch with Strap - 2 x daily - 7 x weekly - 3 reps - 30 sec hold Isometric Ankle Eversion at Wall - 1 x daily - 7 x weekly - 2 sets - 10 reps - 3-5 sec hold Isometric Ankle Dorsiflexion and Plantarflexion - 1 x daily - 7 x weekly - 2 sets - 10 reps - 3 sec hold Supine Single Leg Ankle Pumps - 1 x daily - 7 x weekly - 2 sets - 10 reps - 3 sec hold

## 2021-07-11 NOTE — Therapy (Signed)
[facility] [address]  [place], [place], [address] Phone: [phone]   Fax:  [phone]  Physical Therapy Evaluation  Patient Details  Name: Marcus Henry MRN: [id number] Date of Birth: Mar 06, 1966 Referring Provider (PT): Alric Ran, MD   Encounter Date: 07/11/2021   PT End of Session - 07/11/21 1527     Visit Number 1    Number of Visits 8    Date for PT Re-Evaluation 09/05/21    Authorization Type Aet[employer] - VL: [id number]    PT Start Time 1527    PT Stop Time 1616    PT Time Calculation (min) 49 min    Activity Tolerance Patient tolerated treatment well    Behavior During Therapy [facility] for tasks assessed/performed;Impulsive             Past Medical History:  Diagnosis Date   Hypertension    borderline    No past surgical history on file.  There were no vitals filed for this visit.    Subjective Assessment - 07/11/21 1530     Subjective Pt reports he started noting R foot drop w/o known MOI ~4 months ago. He denies pain in leg or low back but does note altered sensation and temperature in R lower leg and foot. Compenstates with increased hip and knee flexion to promote better foot clearance but has had at least 2 falls related to foot drop. Lumbar MRI findings as below. He denies any NCV or EMG testing.    Limitations Walking    Diagnostic tests 06/20/21 - MRI lumbar spine:  L3-4, L4-5 disc bulging and facet hypertrophy with mild biforaminal stenosis.   EMG/NCV scheduled for 08/31/21.    Patient Stated Goals "I want my foot back."    Currently in Pain? No/denies                [facility] PT Assessment - 07/11/21 1527       Assessment   Medical Diagnosis R foot drop    Referring Provider (PT) Alric Ran, MD    Onset Date/Surgical Date --   ~4 months   Next MD Visit none scheduled    Prior Therapy none      Precautions   Precautions Fall      Restrictions   Weight Bearing Restrictions No       Balance Screen   Has the patient fallen in the past 6 months Yes    How many times? 2    Has the patient had a decrease in activity level because of a fear of falling?  No    Is the patient reluctant to leave their home because of a fear of falling?  No      Home Environment   Living Environment Private residence    Living Arrangements Alone    Type of [place] to enter    Entrance Stairs-Number of Steps 3    [address]. 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, academic around    Leisure "always working";[employer]      Cognition   Overall Cognitive Status Within Functional Limits for tasks assessed      Observation/Other Assessments   Observations tends to sit with legs crossed putting potentially putting  pressure on peroneal nerve      Sensation   Light Touch Impaired by gross assessment   pt reports decreased sensation, numbness and cold feeling in R foot     Coordination   Gross Motor Movements are Fluid and Coordinated No      ROM / Strength   AROM / PROM / Strength AROM;PROM;Strength      AROM   AROM Assessment Site Ankle    Right/Left Ankle Right;Left    Right Ankle Dorsiflexion --   no AROM from resting position at 45   Right Ankle Plantar Flexion 45    Right Ankle Inversion 30    Right Ankle Eversion 6    Left Ankle Dorsiflexion 16    Left Ankle Plantar Flexion 48    Left Ankle Inversion 34    Left Ankle Eversion 18      PROM   PROM Assessment Site Ankle    Right/Left Ankle Right    Right Ankle Dorsiflexion 6    Right Ankle Eversion 28      Strength   Strength Assessment Site Hip;Knee;Ankle    Right/Left Hip Right;Left    Right Hip Flexion 4/5    Right Hip Extension 5/5    Right Hip External Rotation  5/5    Right Hip Internal Rotation 5/5    Right Hip ABduction  5/5    Right Hip ADduction 5/5    Left Hip Flexion 4+/5    Left Hip Extension 5/5    Left Hip External Rotation 5/5    Left Hip Internal Rotation 5/5    Left Hip ABduction 5/5    Left Hip ADduction 5/5    Right/Left Ankle Right;Left    Right Ankle Dorsiflexion 1/5    Right Ankle Plantar Flexion 4+/5   manual resistance   Right Ankle Inversion 4/5    Right Ankle Eversion 2-/5    Left Ankle Dorsiflexion 5/5    Left Ankle Plantar Flexion 5/5    Left Ankle Inversion 5/5    Left Ankle Eversion 5/5      Flexibility   Soft Tissue Assessment /Muscle Length yes   mild/mod tight R gastroc/soleus     Ambulation/Gait   Ambulation/Gait Yes    Ambulation/Gait Assistance 5: Supervision    Assistive device None    Gait Pattern Step-through pattern;Decreased dorsiflexion - right;Right hip hike;Poor foot clearance - right   excessive R hip and knee flexion to clear R foot   Ambulation Surface Level;Indoor                        Objective measurements completed on examination: See above findings.       [facility] Adult PT Treatment/Exercise - 07/11/21 1527       Exercises   Exercises Ankle      Ankle Exercises: Stretches   Gastroc Stretch 2 reps;30 seconds    Gastroc Stretch Limitations longsitting with towel      Ankle Exercises: Supine   Isometrics R ankle DF & eversion isometric with pt instructed to palpable anterior tibialis and peroneal muscle bellies to feel muscle contracting 10 x 5" - cues to slow pace and increase hold time                     PT Education - 07/11/21 1616     Education Details PT eval findings, anticipated POC and initial HEP - Access Code: [email or web address]    Person(s) Educated Patient  Methods Explanation;Demonstration;Verbal cues;Tactile cues;Handout    Comprehension Verbalized understanding;Verbal cues required;Tactile cues required;Returned demonstration;Need further instruction              PT Short Term Goals - 07/11/21 1616        PT SHORT TERM GOAL #1   Title Patient will be independent with initial HEP    Status New    Target Date 08/08/21      PT SHORT TERM GOAL #2   Title Patient will be able to initaite R ankle DF AROM to >/= 10-15 from resting ankle position to allow to improve foot clearance during gait    Status New    Target Date 08/08/21               PT Long Term Goals - 07/11/21 1616       PT LONG TERM GOAL #1   Title Patient will be independent with ongoing/advanced HEP for self-management at home in order to build upon functional gains in therapy    Status New    Target Date 09/05/21      PT LONG TERM GOAL #2   Title Patient to improve R ankle AROM to [facility] to allow for normal gait pattern w/o R foot drop    Status New    Target Date 09/05/21      PT LONG TERM GOAL #3   Title Patient will demonstrate improved R ankle strength to >/= 4+/5 for improved stability and ease of mobility    Status New    Target Date 09/05/21      PT LONG TERM GOAL #4   Title Patient will ambulate with step-through pattern and improved gait mechanics including normal heel-toe progression and no R foot drop    Status New    Target Date 09/05/21                    Plan - 07/11/21 1616     Clinical Impression Statement Precious is a 55 y/o male who presents to OP PT for idiopathic R foot drop of ~4 months duration. He was noted to sit with his legs crossed during the eval in a way in which he could be compressing peroneal nerve. He demonstrates muscle atrophy but is able to elicit trace muscle activity in the R anterior tibialis and peroneal muscles but no AROM into R ankle DF and extremely limited eversion AROM. Mild to moderate tightness present in R gastroc/soleus but R ankle DF PROM to 6 available. No proximal LE weakness observed other than very mild weakness in R hip flexion. He compensates for R foot drop and limited R foot clearance with gait with excessive R hip and knee flexion. Initial  HEP instruction provided in stretching for R gastroc/soleus as well as R ankle DF and inversion isometrics with pt instructed to palpate muscle bellies to feel for muscle contraction and work toward trying to generate AROM. Will plan for trial of NMES to further facilitate muscle activation next visit. Llewelyn will benefit from skilled PT to address deficits listed to restore functional R ankle ROM and strength for improved balance and gait pattern/stability to allow him to resume his normal daily routine. If patient unable to improve active DF and foot clearance during gait, he will likely require a R AFO to improve gait pattern and safety with ambulation.    Personal Factors and Comorbidities Time since onset of injury/illness/exacerbation;Past/Current Experience;Social Background;Comorbidity 3+;Other;Profession   limited understanding of English with refusal  to use interpreter   Comorbidities Uncontrolled DM-II, L knee OA, SOB, HTN, HLD    Examination-Activity Limitations Locomotion Level;Stairs    Examination-Participation Restrictions Community Activity;Driving;Occupation;Shop;Yard Work    Merchant navy officer Evolving/Moderate complexity    Clinical Decision Making Moderate    Rehab Potential Fair    PT Frequency 1x / week    PT Duration 8 weeks    PT Treatment/Interventions ADLs/Self Care Home Management;Electrical Stimulation;DME Instruction;Gait training;Stair training;Functional mobility training;Therapeutic activities;Therapeutic exercise;Balance training;Neuromuscular re-education;Patient/family education;Manual techniques;Passive range of motion;Dry needling;Splinting;Taping    PT Next Visit Plan review initial HEP; trial of NMES to R anterior tibialis +/- peroneals to facilitate DF AROM; R ankle AA/AROM progressing to strengthening as able    PT Home Exercise Plan Access Code: [email or web address] (11/15)    Consulted and Agree with Plan of Care Patient             Patient will  benefit from skilled therapeutic intervention in order to improve the following deficits and impairments:  Abnormal gait, Decreased activity tolerance, Decreased balance, Decreased coordination, Decreased endurance, Decreased knowledge of precautions, Decreased mobility, Decreased range of motion, Decreased safety awareness, Decreased strength, Difficulty walking, Impaired perceived functional ability, Impaired flexibility, Impaired tone, Improper body mechanics, Postural dysfunction  Visit Diagnosis: Foot drop, right - Plan: PT plan of care cert/re-cert  Stiffness of right ankle, not elsewhere classified - Plan: PT plan of care cert/re-cert  Muscle weakness (generalized) - Plan: PT plan of care cert/re-cert  Other abnormalities of gait and mobility - Plan: PT plan of care cert/re-cert  Difficulty in walking, not elsewhere classified - Plan: PT plan of care cert/re-cert     Problem List Patient Active Problem List   Diagnosis Date Noted   Uncontrolled type 2 diabetes mellitus with hyperglycemia ([place]) 04/21/2021   Right foot drop 04/21/2021   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   Gallstones 08/15/2020   Left lower quadrant guarding 06/22/2020   Acute pain of right knee 11/05/2018   Pre-hypertension 11/05/2018   Elevated LDL cholesterol level 11/05/2018   Difficulty sleeping 06/20/2017   Prediabetes 02/08/2016   Male hypogonadism 02/08/2016   Snoring 02/06/2016   Apneic episode 02/06/2016   SOB (shortness of breath) 02/06/2016   Obesity 02/06/2016   Deviated nasal septum, congenital 02/06/2016   Vitamin D insufficiency 02/06/2016   Elevated fasting glucose 02/06/2016   Primary osteoarthritis of left knee 01/20/2016   Groin strain 04/22/2014    Percival Spanish, PT 07/11/2021, 7:39 PM  [facility] [address]  [place], [place], [address] Phone: [phone]   Fax:  [phone]  Name: Marcus Henry MRN:  [id number] Date of Birth: [birth year]

## 2021-07-13 ENCOUNTER — Ambulatory Visit: Payer: No Typology Code available for payment source | Admitting: Physical Therapy

## 2021-07-18 ENCOUNTER — Ambulatory Visit: Payer: No Typology Code available for payment source | Admitting: Physical Therapy

## 2021-07-18 ENCOUNTER — Other Ambulatory Visit: Payer: Self-pay

## 2021-07-18 ENCOUNTER — Encounter: Payer: Self-pay | Admitting: Physical Therapy

## 2021-07-18 DIAGNOSIS — M6281 Muscle weakness (generalized): Secondary | ICD-10-CM

## 2021-07-18 DIAGNOSIS — M21371 Foot drop, right foot: Secondary | ICD-10-CM | POA: Diagnosis not present

## 2021-07-18 DIAGNOSIS — R2689 Other abnormalities of gait and mobility: Secondary | ICD-10-CM

## 2021-07-18 DIAGNOSIS — M25671 Stiffness of right ankle, not elsewhere classified: Secondary | ICD-10-CM

## 2021-07-18 DIAGNOSIS — R262 Difficulty in walking, not elsewhere classified: Secondary | ICD-10-CM

## 2021-07-18 NOTE — Therapy (Signed)
[facility] [address]  [place], [place], [address] Phone: [phone]   Fax:  [phone]  Physical Therapy Treatment  Patient Details  Name: Marcus Henry MRN: [id number] Date of Birth: 05-22-1966 Referring Provider (PT): Alric Ran, MD   Encounter Date: 07/18/2021   PT End of Session - 07/18/21 1447     Visit Number 2    Number of Visits 8    Date for PT Re-Evaluation 09/05/21    Authorization Type Aetna - VL: 120    PT Start Time 5400    PT Stop Time 1535    PT Time Calculation (min) 48 min    Activity Tolerance Patient tolerated treatment well    Behavior During Therapy [facility] for tasks assessed/performed;Impulsive             Past Medical History:  Diagnosis Date   Hypertension    borderline    History reviewed. No pertinent surgical history.  There were no vitals filed for this visit.   Subjective Assessment - 07/18/21 1451     Subjective Pt denies any issues with the initial HEP.    Diagnostic tests 06/20/21 - MRI lumbar spine:  L3-4, L4-5 disc bulging and facet hypertrophy with mild biforaminal stenosis.   EMG/NCV scheduled for 08/31/21.    Patient Stated Goals "I want my foot back."    Currently in Pain? No/denies                               [facility] Adult PT Treatment/Exercise - 07/18/21 1447       Ambulation/Gait   Ambulation/Gait Assistance 5: Supervision    Ambulation/Gait Assistance Details Green TB DF assist with cues for heel strike    Ambulation Distance (Feet) 100 Feet    Assistive device None    Gait Pattern Step-through pattern;Decreased dorsiflexion - right;Right hip hike;Poor foot clearance - right   less R hip and knee flexion necessary to clear R foot   Ambulation Surface Level;Indoor      Exercises   Exercises Ankle      Modalities   Modalities Teacher, English as a foreign language Location L anterior  tibialis & peroneals    Electrical Stimulation Action NMES    Electrical Stimulation Parameters 10 sec on, 5 sec off    Electrical Stimulation Goals Neuromuscular facilitation;Strength      Ankle Exercises: Aerobic   Recumbent Bike L2 x 6 min      Ankle Exercises: Clinical research associate 2 reps;30 seconds    Gastroc Stretch Limitations longsitting with towel & standing runner's stretch   cues not to bounce in stretch     Ankle Exercises: Seated   Ankle Circles/Pumps Right;20 reps    Ankle Circles/Pumps Limitations AAROM ankle pumps - emphasis on DF      Ankle Exercises: Supine   Isometrics R ankle DF & eversion isometric with pt instructed to palpate anterior tibialis and peroneal muscle bellies to feel muscle contracting 10 x 5" - cues to slow pace and increase hold time   limited to no trace muscle activity elicited in ant tib today                      PT Short Term Goals - 07/18/21 1453       PT SHORT TERM GOAL #1   Title  Patient will be independent with initial HEP    Status On-going    Target Date 08/08/21      PT SHORT TERM GOAL #2   Title Patient will be able to initaite R ankle DF AROM to >/= 10-15 from resting ankle position to allow to improve foot clearance during gait    Status On-going    Target Date 08/08/21               PT Long Term Goals - 07/18/21 1453       PT LONG TERM GOAL #1   Title Patient will be independent with ongoing/advanced HEP for self-management at home in order to build upon functional gains in therapy    Status On-going    Target Date 09/05/21      PT LONG TERM GOAL #2   Title Patient to improve R ankle AROM to [facility] to allow for normal gait pattern w/o R foot drop    Status On-going    Target Date 09/05/21      PT LONG TERM GOAL #3   Title Patient will demonstrate improved R ankle strength to >/= 4+/5 for improved stability and ease of mobility    Status On-going    Target Date 09/05/21      PT LONG TERM  GOAL #4   Title Patient will ambulate with step-through pattern and improved gait mechanics including normal heel-toe progression and no R foot drop    Status On-going    Target Date 09/05/21                   Plan - 07/18/21 1454     Clinical Impression Statement Marcus Henry denies any issues with HEP but upon review repeated cueing necessary to focus on desried muscle activation, increase hold times and slow pace with isometrics as well as static hold avoiding bouncing during stretches. Minimal trace to no muscle activation palpated in R anterior tibialis with slightly better muscle activity observed in peroneals - attempted NMES with increased muscle activation noted in peroneals but still limited eversion AROM but no change in anterior tibialis activation. Will plan for trial of NMES with DN next visit. Attempted gait training with temporary DF assist from green TB with decreased hip and knee substitution evident and better heel strike acheived. Pt will likely benefit from external DF assist via toe off AFO or foot drop brace until able determine if return of anterior tibialis and peroneal muscle control possible.    Comorbidities Uncontrolled DM-II, L knee OA, SOB, HTN, HLD    Rehab Potential Fair    PT Frequency 1x / week    PT Duration 8 weeks    PT Treatment/Interventions ADLs/Self Care Home Management;Electrical Stimulation;DME Instruction;Gait training;Stair training;Functional mobility training;Therapeutic activities;Therapeutic exercise;Balance training;Neuromuscular re-education;Patient/family education;Manual techniques;Passive range of motion;Dry needling;Splinting;Taping    PT Next Visit Plan trial of DN + NMES to R anterior tibialis +/- peroneals to facilitate DF AROM; R ankle AA/AROM progressing to strengthening as able; educate on options for AFO vs foot drop brace for abulation    PT Home Exercise Plan Access Code: [email or web address] (11/15)    Consulted and Agree with Plan of Care  Patient             Patient will benefit from skilled therapeutic intervention in order to improve the following deficits and impairments:  Abnormal gait, Decreased activity tolerance, Decreased balance, Decreased coordination, Decreased endurance, Decreased knowledge of precautions, Decreased mobility, Decreased range of motion, Decreased safety  awareness, Decreased strength, Difficulty walking, Impaired perceived functional ability, Impaired flexibility, Impaired tone, Improper body mechanics, Postural dysfunction  Visit Diagnosis: Foot drop, right  Stiffness of right ankle, not elsewhere classified  Muscle weakness (generalized)  Other abnormalities of gait and mobility  Difficulty in walking, not elsewhere classified     Problem List Patient Active Problem List   Diagnosis Date Noted   Uncontrolled type 2 diabetes mellitus with hyperglycemia ([place]) 04/21/2021   Right foot drop 04/21/2021   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   Gallstones 08/15/2020   Left lower quadrant guarding 06/22/2020   Acute pain of right knee 11/05/2018   Pre-hypertension 11/05/2018   Elevated LDL cholesterol level 11/05/2018   Difficulty sleeping 06/20/2017   Prediabetes 02/08/2016   Male hypogonadism 02/08/2016   Snoring 02/06/2016   Apneic episode 02/06/2016   SOB (shortness of breath) 02/06/2016   Obesity 02/06/2016   Deviated nasal septum, congenital 02/06/2016   Vitamin D insufficiency 02/06/2016   Elevated fasting glucose 02/06/2016   Primary osteoarthritis of left knee 01/20/2016   Groin strain 04/22/2014    Percival Spanish, PT 07/18/2021, 6:47 PM  [facility] [address]  [place], [place], [address] Phone: [phone]   Fax:  [phone]  Name: Marcus Henry MRN: [id number] Date of Birth: 02/13/1966

## 2021-07-25 ENCOUNTER — Encounter: Payer: Self-pay | Admitting: Physical Therapy

## 2021-07-25 ENCOUNTER — Ambulatory Visit: Payer: No Typology Code available for payment source | Admitting: Physical Therapy

## 2021-07-25 ENCOUNTER — Other Ambulatory Visit: Payer: Self-pay

## 2021-07-25 DIAGNOSIS — M6281 Muscle weakness (generalized): Secondary | ICD-10-CM

## 2021-07-25 DIAGNOSIS — M21371 Foot drop, right foot: Secondary | ICD-10-CM | POA: Diagnosis not present

## 2021-07-25 DIAGNOSIS — R2689 Other abnormalities of gait and mobility: Secondary | ICD-10-CM

## 2021-07-25 DIAGNOSIS — M25671 Stiffness of right ankle, not elsewhere classified: Secondary | ICD-10-CM

## 2021-07-25 DIAGNOSIS — R262 Difficulty in walking, not elsewhere classified: Secondary | ICD-10-CM

## 2021-07-25 NOTE — Therapy (Signed)
[facility] [address]  [place], [place], [address] Phone: [phone]   Fax:  [phone]  Physical Therapy Treatment  Patient Details  Name: Marcus Henry MRN: [id number] Date of Birth: 05/18/1966 Referring Provider (PT): Alric Ran, MD   Encounter Date: 07/25/2021   PT End of Session - 07/25/21 1530     Visit Number 3    Number of Visits 8    Date for PT Re-Evaluation 09/05/21    Authorization Type Aetna - VL: 120    PT Start Time 8127    PT Stop Time 5170    PT Time Calculation (min) 45 min    Activity Tolerance Patient tolerated treatment well    Behavior During Therapy [facility] for tasks assessed/performed;Impulsive             Past Medical History:  Diagnosis Date   Hypertension    borderline    History reviewed. No pertinent surgical history.  There were no vitals filed for this visit.   Subjective Assessment - 07/25/21 1532     Subjective Pt noting minimal to no improvement in ability to lift toes but feeling of better muscle contraction in peroneals.    Diagnostic tests 06/20/21 - MRI lumbar spine:  L3-4, L4-5 disc bulging and facet hypertrophy with mild biforaminal stenosis.   EMG/NCV scheduled for 08/31/21.    Patient Stated Goals "I want my foot back."    Currently in Pain? No/denies                               [facility] Adult PT Treatment/Exercise - 07/25/21 1530       Exercises   Exercises Ankle      Electrical Stimulation   Electrical Stimulation Location L anterior tibialis & peroneals    Electrical Stimulation Action NMES via DN    Electrical Stimulation Parameters 40Hz  - 10 sec on, 10 sec off    Electrical Stimulation Goals Neuromuscular facilitation;Strength      Ankle Exercises: Aerobic   Recumbent Bike L3 x 6 min      Ankle Exercises: Seated   Toe Raise 20 reps;3 seconds    Toe Raise Limitations AAROM concentric with focus on AROM eccentric       Ankle Exercises: Supine   Other Supine Ankle Exercises AROM/muscle setting for R ankle DF & eversion in conjunction with NMES via DN              Trigger Point Dry Needling - 07/25/21 1530     Consent Given? Yes    Education Handout Provided Yes    Muscles Treated Lower Quadrant Anterior tibialis;Peroneals   Right   Electrical Stimulation Performed with Dry Needling Yes    E-stim with Dry Needling Details estim used fro NMES to facilitate muscle twitch/contraction    Peroneals Response Twitch response elicited;Palpable increased muscle length    Anterior tibialis Response Twitch response elicited;Palpable increased muscle length                   PT Education - 07/25/21 1843     Education Details Provided info on commercially available AFO foot drop braces    Person(s) Educated Patient    Methods Explanation;Handout    Comprehension Verbalized understanding              PT Short Term Goals - 07/18/21 1453       PT  SHORT TERM GOAL #1   Title Patient will be independent with initial HEP    Status On-going    Target Date 08/08/21      PT SHORT TERM GOAL #2   Title Patient will be able to initaite R ankle DF AROM to >/= 10-15 from resting ankle position to allow to improve foot clearance during gait    Status On-going    Target Date 08/08/21               PT Long Term Goals - 07/18/21 1453       PT LONG TERM GOAL #1   Title Patient will be independent with ongoing/advanced HEP for self-management at home in order to build upon functional gains in therapy    Status On-going    Target Date 09/05/21      PT LONG TERM GOAL #2   Title Patient to improve R ankle AROM to [facility] to allow for normal gait pattern w/o R foot drop    Status On-going    Target Date 09/05/21      PT LONG TERM GOAL #3   Title Patient will demonstrate improved R ankle strength to >/= 4+/5 for improved stability and ease of mobility    Status On-going    Target Date 09/05/21       PT LONG TERM GOAL #4   Title Patient will ambulate with step-through pattern and improved gait mechanics including normal heel-toe progression and no R foot drop    Status On-going    Target Date 09/05/21                   Plan - 07/25/21 1534     Clinical Impression Statement Jasani reports minimal to no change in active DF on R foot but does note improved sensation of muscle contraction in R peroneal muscles. Attempted DN + NMES to R anterior tibialis with minimal muscle activation even at higher intensity, although more consistent muscle contraction elicited in R peroneals with slightly improved eversion AROM. Instructed pt in R ankle DF AAROM using strap assist for concentric motion and encouraging eccentric AROM. Given limited return of DF control thus far provided information on commercially available AFO foot drop braces and discussed options for more permanent toe off AFO if further improvement not achieved.    Comorbidities Uncontrolled DM-II, L knee OA, SOB, HTN, HLD    Rehab Potential Fair    PT Frequency 1x / week    PT Duration 8 weeks    PT Treatment/Interventions ADLs/Self Care Home Management;Electrical Stimulation;DME Instruction;Gait training;Stair training;Functional mobility training;Therapeutic activities;Therapeutic exercise;Balance training;Neuromuscular re-education;Patient/family education;Manual techniques;Passive range of motion;Dry needling;Splinting;Taping    PT Next Visit Plan NMES +/- DN to R anterior tibialis +/- peroneals to facilitate DF AROM; R ankle AA/AROM progressing to strengthening as able    PT Home Exercise Plan Access Code: [email or web address] (11/15)    Consulted and Agree with Plan of Care Patient             Patient will benefit from skilled therapeutic intervention in order to improve the following deficits and impairments:  Abnormal gait, Decreased activity tolerance, Decreased balance, Decreased coordination, Decreased endurance, Decreased  knowledge of precautions, Decreased mobility, Decreased range of motion, Decreased safety awareness, Decreased strength, Difficulty walking, Impaired perceived functional ability, Impaired flexibility, Impaired tone, Improper body mechanics, Postural dysfunction  Visit Diagnosis: Foot drop, right  Stiffness of right ankle, not elsewhere classified  Muscle weakness (generalized)  Other abnormalities of gait  and mobility  Difficulty in walking, not elsewhere classified     Problem List Patient Active Problem List   Diagnosis Date Noted   Uncontrolled type 2 diabetes mellitus with hyperglycemia ([place]) 04/21/2021   Right foot drop 04/21/2021   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   Gallstones 08/15/2020   Left lower quadrant guarding 06/22/2020   Acute pain of right knee 11/05/2018   Pre-hypertension 11/05/2018   Elevated LDL cholesterol level 11/05/2018   Difficulty sleeping 06/20/2017   Prediabetes 02/08/2016   Male hypogonadism 02/08/2016   Snoring 02/06/2016   Apneic episode 02/06/2016   SOB (shortness of breath) 02/06/2016   Obesity 02/06/2016   Deviated nasal septum, congenital 02/06/2016   Vitamin D insufficiency 02/06/2016   Elevated fasting glucose 02/06/2016   Primary osteoarthritis of left knee 01/20/2016   Groin strain 04/22/2014    Percival Spanish, PT 07/25/2021, 7:07 PM  [facility] [address]  [place], [place], [address] Phone: [phone]   Fax:  [phone]  Name: Dragan Tamburrino MRN: [id number] Date of Birth: 1965/12/12

## 2021-08-01 ENCOUNTER — Other Ambulatory Visit: Payer: Self-pay

## 2021-08-01 ENCOUNTER — Ambulatory Visit: Payer: No Typology Code available for payment source | Attending: Neurology | Admitting: Physical Therapy

## 2021-08-01 ENCOUNTER — Encounter: Payer: Self-pay | Admitting: Physical Therapy

## 2021-08-01 DIAGNOSIS — M6281 Muscle weakness (generalized): Secondary | ICD-10-CM | POA: Insufficient documentation

## 2021-08-01 DIAGNOSIS — R2689 Other abnormalities of gait and mobility: Secondary | ICD-10-CM | POA: Diagnosis present

## 2021-08-01 DIAGNOSIS — M21371 Foot drop, right foot: Secondary | ICD-10-CM | POA: Insufficient documentation

## 2021-08-01 DIAGNOSIS — R262 Difficulty in walking, not elsewhere classified: Secondary | ICD-10-CM | POA: Insufficient documentation

## 2021-08-01 DIAGNOSIS — M25671 Stiffness of right ankle, not elsewhere classified: Secondary | ICD-10-CM | POA: Insufficient documentation

## 2021-08-01 NOTE — Therapy (Signed)
[facility] [address]  [place], [place], [address] Phone: [phone]   Fax:  [phone]  Physical Therapy Treatment  Patient Details  Name: Marcus Henry MRN: [id number] Date of Birth: 1966/07/31 Referring Provider (PT): Alric Ran, MD   Encounter Date: 08/01/2021   PT End of Session - 08/01/21 1446     Visit Number 4    Number of Visits 8    Date for PT Re-Evaluation 09/05/21    Authorization Type Aetna - VL: 120    PT Start Time 6962    PT Stop Time 9528    PT Time Calculation (min) 44 min    Activity Tolerance Patient tolerated treatment well    Behavior During Therapy [facility] for tasks assessed/performed;Impulsive             Past Medical History:  Diagnosis Date   Hypertension    borderline    History reviewed. No pertinent surgical history.  There were no vitals filed for this visit.   Subjective Assessment - 08/01/21 1450     Subjective Pt reports he ordered on of the foot drop braces we discussed last visit but has not received it yet. He states his leg feels better since the DN last visit.    Diagnostic tests 06/20/21 - MRI lumbar spine:  L3-4, L4-5 disc bulging and facet hypertrophy with mild biforaminal stenosis.   EMG/NCV scheduled for 08/31/21.    Patient Stated Goals "I want my foot back."    Currently in Pain? No/denies                               [facility] Adult PT Treatment/Exercise - 08/01/21 1446       Exercises   Exercises Ankle      Electrical Stimulation   Electrical Stimulation Location L anterior tibialis & peroneals    Electrical Stimulation Action NMES via DN & TENS/NMES unit    Electrical Stimulation Parameters 40Hz  - 10 sec on, 10 sec off with DN; 150Hz  - 10 sec on, 10 sec off with NMES/TENS unit    Electrical Stimulation Goals Neuromuscular facilitation;Strength      Ankle Exercises: Aerobic   Recumbent Bike L3 x 6 min      Ankle  Exercises: Seated   Ankle Circles/Pumps Right;10 reps    Ankle Circles/Pumps Limitations ankle circles      Ankle Exercises: Supine   Other Supine Ankle Exercises AROM/muscle setting for R ankle DF & eversion in conjunction with NMES - cues to increase contraction hold time    Other Supine Ankle Exercises AROM eversion 10 x 3-5"              Trigger Point Dry Needling - 08/01/21 1446     Consent Given? Yes    Electrical Stimulation Performed with Dry Needling Yes    E-stim with Dry Needling Details estim used for NMES to facilitate muscle twitch/contraction    Peroneals Response Twitch response elicited;Palpable increased muscle length   strong twitch   Anterior tibialis Response Twitch response elicited;Palpable increased muscle length   mild twitch                    PT Short Term Goals - 08/01/21 1453       PT SHORT TERM GOAL #1   Title Patient will be independent with initial HEP    Status On-going  PT SHORT TERM GOAL #2   Title Patient will be able to initaite R ankle DF AROM to >/= 10-15 from resting ankle position to allow to improve foot clearance during gait    Status On-going    Target Date 08/08/21               PT Long Term Goals - 07/18/21 1453       PT LONG TERM GOAL #1   Title Patient will be independent with ongoing/advanced HEP for self-management at home in order to build upon functional gains in therapy    Status On-going    Target Date 09/05/21      PT LONG TERM GOAL #2   Title Patient to improve R ankle AROM to [facility] to allow for normal gait pattern w/o R foot drop    Status On-going    Target Date 09/05/21      PT LONG TERM GOAL #3   Title Patient will demonstrate improved R ankle strength to >/= 4+/5 for improved stability and ease of mobility    Status On-going    Target Date 09/05/21      PT LONG TERM GOAL #4   Title Patient will ambulate with step-through pattern and improved gait mechanics including normal heel-toe  progression and no R foot drop    Status On-going    Target Date 09/05/21                   Plan - 08/01/21 1530     Clinical Impression Statement Marcus Henry reports improved awareness of the feeling and muscle activation in his R lower leg with improved R ankle eversion AROM noted but still with only trace muscle activity in R anterior tibialis. Pt feels DN last visit really helped with facilitation of improved muscle activation awareness, therefore continued use of DN + NMES to R anterior tibialis and peroneals as well as NMES using standard electrodes for anterior tibialis. By end of session, pt able to produce good eversion AROM although cues necessary for slower pacing and increased hold time of contraction. Slight improvement also noted in eccentric control of DF but still unable to elicit AROM into DF. He reports he ordered the foot drop brace discussed last visit and expects delivery tomorrow.    Comorbidities Uncontrolled DM-II, L knee OA, SOB, HTN, HLD    Rehab Potential Fair    PT Frequency 1x / week    PT Duration 8 weeks    PT Treatment/Interventions ADLs/Self Care Home Management;Electrical Stimulation;DME Instruction;Gait training;Stair training;Functional mobility training;Therapeutic activities;Therapeutic exercise;Balance training;Neuromuscular re-education;Patient/family education;Manual techniques;Passive range of motion;Dry needling;Splinting;Taping    PT Next Visit Plan NMES +/- DN to R anterior tibialis +/- peroneals to facilitate DF AROM; R ankle AA/AROM progressing to strengthening as able    PT Home Exercise Plan Access Code: [email or web address] (11/15)    Consulted and Agree with Plan of Care Patient             Patient will benefit from skilled therapeutic intervention in order to improve the following deficits and impairments:  Abnormal gait, Decreased activity tolerance, Decreased balance, Decreased coordination, Decreased endurance, Decreased knowledge of precautions,  Decreased mobility, Decreased range of motion, Decreased safety awareness, Decreased strength, Difficulty walking, Impaired perceived functional ability, Impaired flexibility, Impaired tone, Improper body mechanics, Postural dysfunction  Visit Diagnosis: Foot drop, right  Stiffness of right ankle, not elsewhere classified  Muscle weakness (generalized)  Other abnormalities of gait and mobility  Difficulty in walking, not  elsewhere classified     Problem List Patient Active Problem List   Diagnosis Date Noted   Uncontrolled type 2 diabetes mellitus with hyperglycemia ([place]) 04/21/2021   Right foot drop 04/21/2021   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   Gallstones 08/15/2020   Left lower quadrant guarding 06/22/2020   Acute pain of right knee 11/05/2018   Pre-hypertension 11/05/2018   Elevated LDL cholesterol level 11/05/2018   Difficulty sleeping 06/20/2017   Prediabetes 02/08/2016   Male hypogonadism 02/08/2016   Snoring 02/06/2016   Apneic episode 02/06/2016   SOB (shortness of breath) 02/06/2016   Obesity 02/06/2016   Deviated nasal septum, congenital 02/06/2016   Vitamin D insufficiency 02/06/2016   Elevated fasting glucose 02/06/2016   Primary osteoarthritis of left knee 01/20/2016   Groin strain 04/22/2014    Percival Spanish, PT 08/01/2021, [facility] [address]  [place], [place], [address] Phone: [phone]   Fax:  [phone]  Name: Marcus Henry MRN: [id number] Date of Birth: 1966/08/23

## 2021-08-02 ENCOUNTER — Other Ambulatory Visit: Payer: Self-pay | Admitting: Physician Assistant

## 2021-08-02 DIAGNOSIS — E1165 Type 2 diabetes mellitus with hyperglycemia: Secondary | ICD-10-CM

## 2021-08-08 ENCOUNTER — Encounter: Payer: Self-pay | Admitting: Physical Therapy

## 2021-08-08 ENCOUNTER — Other Ambulatory Visit: Payer: Self-pay

## 2021-08-08 ENCOUNTER — Ambulatory Visit: Payer: No Typology Code available for payment source | Admitting: Physical Therapy

## 2021-08-08 DIAGNOSIS — R262 Difficulty in walking, not elsewhere classified: Secondary | ICD-10-CM

## 2021-08-08 DIAGNOSIS — M6281 Muscle weakness (generalized): Secondary | ICD-10-CM

## 2021-08-08 DIAGNOSIS — M25671 Stiffness of right ankle, not elsewhere classified: Secondary | ICD-10-CM

## 2021-08-08 DIAGNOSIS — R2689 Other abnormalities of gait and mobility: Secondary | ICD-10-CM

## 2021-08-08 DIAGNOSIS — M21371 Foot drop, right foot: Secondary | ICD-10-CM

## 2021-08-08 NOTE — Therapy (Signed)
[facility] [address]  [place], [place], [address] Phone: [phone]   Fax:  [phone]  Physical Therapy Treatment  Patient Details  Name: Marcus Henry MRN: [id number] Date of Birth: 1966/03/08 Referring Provider (PT): Alric Ran, MD   Encounter Date: 08/08/2021   PT End of Session - 08/08/21 1445     Visit Number 5    Number of Visits 8    Date for PT Re-Evaluation 09/05/21    Authorization Type Aetna - VL: 120    PT Start Time 9357    PT Stop Time 1529    PT Time Calculation (min) 44 min    Activity Tolerance Patient tolerated treatment well    Behavior During Therapy [name] for tasks assessed/performed;Impulsive             Past Medical History:  Diagnosis Date   Hypertension    borderline    History reviewed. No pertinent surgical history.  There were no vitals filed for this visit.   Subjective Assessment - 08/08/21 1448     Subjective Foot drop brace received and pt feels that it helps when he uses it but did not wear it to PT today.    Diagnostic tests 06/20/21 - MRI lumbar spine:  L3-4, L4-5 disc bulging and facet hypertrophy with mild biforaminal stenosis.   EMG/NCV scheduled for 08/31/21.    Patient Stated Goals "I want my foot back."    Currently in Pain? No/denies                [facility] PT Assessment - 08/08/21 1445       AROM   Right Ankle Dorsiflexion -10    Right Ankle Plantar Flexion 45    Right Ankle Inversion 31    Right Ankle Eversion 14      PROM   Right Ankle Dorsiflexion 18                           [facility] Adult PT Treatment/Exercise - 08/08/21 1445       Ambulation/Gait   Ambulation/Gait Assistance 5: Supervision    Ambulation/Gait Assistance Details cues for heel strike with slow eccentric lowering of forefoot to floor    Ambulation Distance (Feet) 80 Feet    Assistive device None    Gait Pattern Step-through pattern;Decreased  dorsiflexion - right;Right hip hike;Poor foot clearance - right   less R hip and knee flexion necessary to clear R foot     Exercises   Exercises Ankle      Manual Therapy   Manual Therapy Soft tissue mobilization;Myofascial release;Passive ROM    Manual therapy comments skilled palpation and monitoring during DN    Soft tissue mobilization STM/DTM to R lateral gastroc/soleus    Myofascial Release manual TPR and pin & stretch to R lateral gastroc    Passive ROM manual DF stretches with knee flexed and straight in prone 2 x 30 sec each following DN & STM/MFR      Ankle Exercises: Aerobic   Recumbent Bike L3 x 6 min      Ankle Exercises: Stretches   Soleus Stretch 2 reps;30 seconds    Soleus Stretch Limitations R LE - standing runner's stretch at wall    Gastroc Stretch 2 reps;30 seconds    Gastroc Stretch Limitations R LE - standing runner's stretch at wall      Ankle Exercises: Seated   Toe Raise  20 reps;3 seconds   2 sets   Toe Raise Limitations AAROM concentric with focus on AROM eccentric for 1st set; AROM for 2nd set    Other Seated Ankle Exercises R ankle DF & eversion with yellow TB x 10 each              Trigger Point Dry Needling - 08/08/21 1445     Consent Given? Yes    Muscles Treated Lower Quadrant Gastrocnemius;Soleus   R lateral   Gastrocnemius Response Twitch response elicited;Palpable increased muscle length    Soleus Response Twitch response elicited;Palpable increased muscle length                   PT Education - 08/08/21 1529     Education Details HEP update - standing gastroc/soleus stretch, yellow TB R ankle DF & eversion - Access Code: [email or web address]    Person(s) Educated Patient    Methods Explanation;Demonstration;Verbal cues;Tactile cues;Handout    Comprehension Verbalized understanding;Verbal cues required;Tactile cues required;Returned demonstration;Need further instruction              PT Short Term Goals - 08/08/21 1450       PT  SHORT TERM GOAL #1   Title Patient will be independent with initial HEP    Status Achieved   08/08/21     PT SHORT TERM GOAL #2   Title Patient will be able to initaite R ankle DF AROM to >/= 10-15 from resting ankle position to allow to improve foot clearance during gait    Status Achieved   08/08/21              PT Long Term Goals - 08/08/21 1450       PT LONG TERM GOAL #1   Title Patient will be independent with ongoing/advanced HEP for self-management at home in order to build upon functional gains in therapy    Status On-going    Target Date 09/05/21      PT LONG TERM GOAL #2   Title Patient to improve R ankle AROM to [facility] to allow for normal gait pattern w/o R foot drop    Status On-going    Target Date 09/05/21      PT LONG TERM GOAL #3   Title Patient will demonstrate improved R ankle strength to >/= 4+/5 for improved stability and ease of mobility    Status On-going    Target Date 09/05/21      PT LONG TERM GOAL #4   Title Patient will ambulate with step-through pattern and improved gait mechanics including normal heel-toe progression and no R foot drop    Status On-going    Target Date 09/05/21                   Plan - 08/08/21 1529     Clinical Impression Statement Marcus Henry states he feels that his ability to lift his toes up is a little better but still with pronounced gait substitutions utilizing hip and knee flexion to achieve foot clearance as pt did not wear his foot drop brace to his PT session. Pt able to elicit AROM into DF from resting ankle position in PF but only to -10 from neutral - STG #2 met. TPs identified in R lateral gastroc and soleus which were likely contributing to inhibition of active DF - addressed with manual STM/DTM/MFR and DN with improved DF PROM to +18 and improved AROM DF noted following MT including ability to initiate AROM  into yellow TB resistance. HEP updated to progress gastroc and soleus stretching as well as adding TB  resisted AROM. Session concluded with gait training encouraging heel strike on weigh acceptance to facilitate functional DF and reduce hip and knee flexion substitution during gait.    Comorbidities Uncontrolled DM-II, L knee OA, SOB, HTN, HLD    Rehab Potential Fair    PT Frequency 1x / week    PT Duration 8 weeks    PT Treatment/Interventions ADLs/Self Care Home Management;Electrical Stimulation;DME Instruction;Gait training;Stair training;Functional mobility training;Therapeutic activities;Therapeutic exercise;Balance training;Neuromuscular re-education;Patient/family education;Manual techniques;Passive range of motion;Dry needling;Splinting;Taping    PT Next Visit Plan R ankle AA/AROM progressing to strengthening as able; NMES +/- DN to R anterior tibialis +/- peroneals to facilitate DF AROM    PT Home Exercise Plan Access Code: [email or web address] (11/15, updated 12/13)    Consulted and Agree with Plan of Care Patient             Patient will benefit from skilled therapeutic intervention in order to improve the following deficits and impairments:  Abnormal gait, Decreased activity tolerance, Decreased balance, Decreased coordination, Decreased endurance, Decreased knowledge of precautions, Decreased mobility, Decreased range of motion, Decreased safety awareness, Decreased strength, Difficulty walking, Impaired perceived functional ability, Impaired flexibility, Impaired tone, Improper body mechanics, Postural dysfunction  Visit Diagnosis: Foot drop, right  Stiffness of right ankle, not elsewhere classified  Muscle weakness (generalized)  Other abnormalities of gait and mobility  Difficulty in walking, not elsewhere classified     Problem List Patient Active Problem List   Diagnosis Date Noted   Uncontrolled type 2 diabetes mellitus with hyperglycemia ([place]) 04/21/2021   Right foot drop 04/21/2021   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   Gallstones 08/15/2020   Left lower quadrant guarding  06/22/2020   Acute pain of right knee 11/05/2018   Pre-hypertension 11/05/2018   Elevated LDL cholesterol level 11/05/2018   Difficulty sleeping 06/20/2017   Prediabetes 02/08/2016   Male hypogonadism 02/08/2016   Snoring 02/06/2016   Apneic episode 02/06/2016   SOB (shortness of breath) 02/06/2016   Obesity 02/06/2016   Deviated nasal septum, congenital 02/06/2016   Vitamin D insufficiency 02/06/2016   Elevated fasting glucose 02/06/2016   Primary osteoarthritis of left knee 01/20/2016   Groin strain 04/22/2014    Percival Spanish, PT 08/08/2021, 7:12 PM  [facility] [address]  [place], [place], [address] Phone: [phone]   Fax:  [phone]  Name: Marcus Henry MRN: [id number] Date of Birth: 03-30-1966

## 2021-08-08 NOTE — Patient Instructions (Signed)
° ° °  Access Code: [email or web address] URL: [email or web address] Date: 08/08/2021 Prepared by: Annie Paras  Exercises Long Sitting Calf Stretch with Strap - 2 x daily - 7 x weekly - 3 reps - 30 sec hold Isometric Ankle Eversion at Wall - 1 x daily - 7 x weekly - 2 sets - 10 reps - 3-5 sec hold Isometric Ankle Dorsiflexion and Plantarflexion - 1 x daily - 7 x weekly - 2 sets - 10 reps - 3 sec hold Supine Single Leg Ankle Pumps - 1 x daily - 7 x weekly - 2 sets - 10 reps - 3 sec hold Gastroc Stretch on Wall - 2-3 x daily - 7 x weekly - 3 reps - 30 sec hold Soleus Stretch on Wall - 2-3 x daily - 7 x weekly - 3 reps - 30 sec hold Seated Ankle Dorsiflexion with Resistance - 1 x daily - 7 x weekly - 2 sets - 10 reps - 3 sec hold Seated Ankle Eversion with Resistance - 1 x daily - 7 x weekly - 2 sets - 10 reps - 3 sec hold

## 2021-08-15 ENCOUNTER — Ambulatory Visit: Payer: No Typology Code available for payment source | Admitting: Physical Therapy

## 2021-08-15 ENCOUNTER — Encounter: Payer: Self-pay | Admitting: Physical Therapy

## 2021-08-15 ENCOUNTER — Other Ambulatory Visit: Payer: Self-pay

## 2021-08-15 DIAGNOSIS — R2689 Other abnormalities of gait and mobility: Secondary | ICD-10-CM

## 2021-08-15 DIAGNOSIS — M25671 Stiffness of right ankle, not elsewhere classified: Secondary | ICD-10-CM

## 2021-08-15 DIAGNOSIS — M21371 Foot drop, right foot: Secondary | ICD-10-CM

## 2021-08-15 DIAGNOSIS — M6281 Muscle weakness (generalized): Secondary | ICD-10-CM

## 2021-08-15 DIAGNOSIS — R262 Difficulty in walking, not elsewhere classified: Secondary | ICD-10-CM

## 2021-08-15 NOTE — Therapy (Signed)
[facility] [address]  [place], [place], [address] Phone: [phone]   Fax:  [phone]  Physical Therapy Treatment  Patient Details  Name: Marcus Henry MRN: [id number] Date of Birth: 08/18/1966 Referring Provider (PT): Alric Ran, MD   Encounter Date: 08/15/2021   PT End of Session - 08/15/21 1617     Visit Number 6    Number of Visits 9    Date for PT Re-Evaluation 09/05/21    Authorization Type Aetna - VL: [id number]    PT Start Time 4627    PT Stop Time 1702    PT Time Calculation (min) 45 min    Activity Tolerance Patient tolerated treatment well    Behavior During Therapy [facility] for tasks assessed/performed;Impulsive             Past Medical History:  Diagnosis Date   Hypertension    borderline    History reviewed. No pertinent surgical history.  There were no vitals filed for this visit.   Subjective Assessment - 08/15/21 1619     Subjective Pt reports he has not been using the foot drop brace much as he is normally wearing his high-top work boots which he thinks helps with holding his toes up.    Diagnostic tests 06/20/21 - MRI lumbar spine:  L3-4, L4-5 disc bulging and facet hypertrophy with mild biforaminal stenosis.   EMG/NCV scheduled for 08/31/21.    Patient Stated Goals "I want my foot back."    Currently in Pain? No/denies                               [facility] Adult PT Treatment/Exercise - 08/15/21 1617       Exercises   Exercises Ankle      Ankle Exercises: Aerobic   Recumbent Bike L3 x 6 min      Ankle Exercises: Seated   BAPS Sitting;Level 2;10 reps    BAPS Weights (lbs) 5   at AL   BAPS Limitations DF/PF, IV/EV & CW/CCW 2 x 10 reps each w/o added weight; x 10 each with 5# at AL    Other Seated Ankle Exercises R ankle DF & eversion with yellow TB 2 x 10 each   cues for slower controlled motion, esp on eccentric motion     Ankle Exercises: Sidelying    Ankle Eversion Right;10 reps;AROM;Strengthening;Theraband   2 sets   Theraband Level (Ankle Eversion) Level 1 (Yellow)   2nd set                      PT Short Term Goals - 08/08/21 1450       PT SHORT TERM GOAL #1   Title Patient will be independent with initial HEP    Status Achieved   08/08/21     PT SHORT TERM GOAL #2   Title Patient will be able to initaite R ankle DF AROM to >/= 10-15 from resting ankle position to allow to improve foot clearance during gait    Status Achieved   08/08/21              PT Long Term Goals - 08/08/21 1450       PT LONG TERM GOAL #1   Title Patient will be independent with ongoing/advanced HEP for self-management at home in order to build upon functional gains in therapy    Status On-going  Target Date 09/05/21      PT LONG TERM GOAL #2   Title Patient to improve R ankle AROM to [facility] to allow for normal gait pattern w/o R foot drop    Status On-going    Target Date 09/05/21      PT LONG TERM GOAL #3   Title Patient will demonstrate improved R ankle strength to >/= 4+/5 for improved stability and ease of mobility    Status On-going    Target Date 09/05/21      PT LONG TERM GOAL #4   Title Patient will ambulate with step-through pattern and improved gait mechanics including normal heel-toe progression and no R foot drop    Status On-going    Target Date 09/05/21                   Plan - 08/15/21 1702     Clinical Impression Statement Marcus Henry continues to demonstrate excessive R hip and knee flexion as compensation for lack of/limited R ankle DF despite returning muscle activation. He is not using the foot drop brace as recommended, stating that he is usually in high-top work boots which he feels helps him keep his toes up - encouraged more regular use of foot drop brace when not in work boots to promote AAROM for DF with gait. He continues to require frequent cues for appropriate pacing during exercises to  maximize muscle activation and control. Better R ankle DF AROM noted with knee flexed than straight with increased gastroc tightness limiting even PROM, therefore reinforced need for continued calf stretching with reminders necessary for proper technique and hold times.    Comorbidities Uncontrolled DM-II, L knee OA, SOB, HTN, HLD    Rehab Potential Fair    PT Frequency 1x / week    PT Duration 8 weeks    PT Treatment/Interventions ADLs/Self Care Home Management;Electrical Stimulation;DME Instruction;Gait training;Stair training;Functional mobility training;Therapeutic activities;Therapeutic exercise;Balance training;Neuromuscular re-education;Patient/family education;Manual techniques;Passive range of motion;Dry needling;Splinting;Taping    PT Next Visit Plan R ankle AA/AROM progressing to strengthening as able; NMES +/- DN to R anterior tibialis +/- peroneals to facilitate DF AROM    PT Home Exercise Plan Access Code: [email or web address] (11/15, updated 12/13)    Consulted and Agree with Plan of Care Patient             Patient will benefit from skilled therapeutic intervention in order to improve the following deficits and impairments:  Abnormal gait, Decreased activity tolerance, Decreased balance, Decreased coordination, Decreased endurance, Decreased knowledge of precautions, Decreased mobility, Decreased range of motion, Decreased safety awareness, Decreased strength, Difficulty walking, Impaired perceived functional ability, Impaired flexibility, Impaired tone, Improper body mechanics, Postural dysfunction  Visit Diagnosis: Foot drop, right  Stiffness of right ankle, not elsewhere classified  Muscle weakness (generalized)  Other abnormalities of gait and mobility  Difficulty in walking, not elsewhere classified     Problem List Patient Active Problem List   Diagnosis Date Noted   Uncontrolled type 2 diabetes mellitus with hyperglycemia ([place]) 04/21/2021   Right foot drop 04/21/2021    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   Gallstones 08/15/2020   Left lower quadrant guarding 06/22/2020   Acute pain of right knee 11/05/2018   Pre-hypertension 11/05/2018   Elevated LDL cholesterol level 11/05/2018   Difficulty sleeping 06/20/2017   Prediabetes 02/08/2016   Male hypogonadism 02/08/2016   Snoring 02/06/2016   Apneic episode 02/06/2016   SOB (shortness of breath) 02/06/2016   Obesity 02/06/2016   Deviated nasal septum,  congenital 02/06/2016   Vitamin D insufficiency 02/06/2016   Elevated fasting glucose 02/06/2016   Primary osteoarthritis of left knee 01/20/2016   Groin strain 04/22/2014    Percival Spanish, PT 08/15/2021, 7:07 PM  [facility] [address]  [place], [place], [address] Phone: [phone]   Fax:  [phone]  Name: Marcus Henry MRN: [id number] Date of Birth: [birth year]

## 2021-08-22 ENCOUNTER — Other Ambulatory Visit: Payer: Self-pay | Admitting: Physician Assistant

## 2021-08-22 DIAGNOSIS — E1165 Type 2 diabetes mellitus with hyperglycemia: Secondary | ICD-10-CM

## 2021-08-23 ENCOUNTER — Ambulatory Visit: Payer: No Typology Code available for payment source

## 2021-08-23 ENCOUNTER — Other Ambulatory Visit: Payer: Self-pay

## 2021-08-23 DIAGNOSIS — M6281 Muscle weakness (generalized): Secondary | ICD-10-CM

## 2021-08-23 DIAGNOSIS — M21371 Foot drop, right foot: Secondary | ICD-10-CM

## 2021-08-23 DIAGNOSIS — R2689 Other abnormalities of gait and mobility: Secondary | ICD-10-CM

## 2021-08-23 DIAGNOSIS — R262 Difficulty in walking, not elsewhere classified: Secondary | ICD-10-CM

## 2021-08-23 DIAGNOSIS — M25671 Stiffness of right ankle, not elsewhere classified: Secondary | ICD-10-CM

## 2021-08-23 NOTE — Therapy (Signed)
[facility] [address]  [place], [place], [address] Phone: [phone]   Fax:  [phone]  Physical Therapy Treatment  Patient Details  Name: Marcus Henry MRN: [id number] Date of Birth: [birth year] Referring Provider (PT): Alric Ran, MD   Encounter Date: 08/23/2021   PT End of Session - 08/23/21 0930     Visit Number 7    Number of Visits 9    Date for PT Re-Evaluation 09/05/21    Authorization Type Aetna - VL: 120    PT Start Time 5909    PT Stop Time 0928    PT Time Calculation (min) 44 min    Activity Tolerance Patient tolerated treatment well;Patient limited by fatigue    Behavior During Therapy [facility] for tasks assessed/performed;Impulsive             Past Medical History:  Diagnosis Date   Hypertension    borderline    History reviewed. No pertinent surgical history.  There were no vitals filed for this visit.   Subjective Assessment - 08/23/21 0844     Subjective Pt reports that everything is going alright, does not have the brace on now because he woke up too late.    Diagnostic tests 06/20/21 - MRI lumbar spine:  L3-4, L4-5 disc bulging and facet hypertrophy with mild biforaminal stenosis.   EMG/NCV scheduled for 08/31/21.    Patient Stated Goals "I want my foot back."    Currently in Pain? No/denies                               [facility] Adult PT Treatment/Exercise - 08/23/21 0001       Exercises   Exercises Ankle      Ankle Exercises: Aerobic   Recumbent Bike L3 x 6 min      Ankle Exercises: Stretches   Gastroc Stretch 2 reps;30 seconds    Gastroc Stretch Limitations seated with towel    Other Stretch runner stretch at wall 30 sec      Ankle Exercises: Standing   Heel Raises Both;20 reps   with counter support   Toe Raise 20 reps   with counter support   Other Standing Ankle Exercises toe taps on 6' step 20 reps    Other Standing Ankle Exercises R step  ups on 4' step focusing on ankle DF when going up 10x      Ankle Exercises: Seated   Ankle Circles/Pumps Right;10 reps    Ankle Circles/Pumps Limitations ankle circles CW/CCW 10x each with 2# cuff weight    Other Seated Ankle Exercises R ankle EV with 2# cuff weight 10x    Other Seated Ankle Exercises R ankle EV, DF, PF with yellow TB 2x10                       PT Short Term Goals - 08/08/21 1450       PT SHORT TERM GOAL #1   Title Patient will be independent with initial HEP    Status Achieved   08/08/21     PT SHORT TERM GOAL #2   Title Patient will be able to initaite R ankle DF AROM to >/= 10-15 from resting ankle position to allow to improve foot clearance during gait    Status Achieved   08/08/21              PT  Long Term Goals - 08/08/21 1450       PT LONG TERM GOAL #1   Title Patient will be independent with ongoing/advanced HEP for self-management at home in order to build upon functional gains in therapy    Status On-going    Target Date 09/05/21      PT LONG TERM GOAL #2   Title Patient to improve R ankle AROM to [facility] to allow for normal gait pattern w/o R foot drop    Status On-going    Target Date 09/05/21      PT LONG TERM GOAL #3   Title Patient will demonstrate improved R ankle strength to >/= 4+/5 for improved stability and ease of mobility    Status On-going    Target Date 09/05/21      PT LONG TERM GOAL #4   Title Patient will ambulate with step-through pattern and improved gait mechanics including normal heel-toe progression and no R foot drop    Status On-going    Target Date 09/05/21                   Plan - 08/23/21 0931     Clinical Impression Statement Pt demonstrated difficulty with overall ankle mobility. DF remains limited with him. Interventions were focused on increasing DF to improve foot clearance during gait. He showed a lot of lack of control when trying to control the eccentric motion of DF. His R ankle  was very fatigued after the exercises today.    Personal Factors and Comorbidities Time since onset of injury/illness/exacerbation;Past/Current Experience;Social Background;Comorbidity 3+;Other;Profession    Comorbidities Uncontrolled DM-II, L knee OA, SOB, HTN, HLD    PT Frequency 1x / week    PT Duration 8 weeks    PT Treatment/Interventions ADLs/Self Care Home Management;Electrical Stimulation;DME Instruction;Gait training;Stair training;Functional mobility training;Therapeutic activities;Therapeutic exercise;Balance training;Neuromuscular re-education;Patient/family education;Manual techniques;Passive range of motion;Dry needling;Splinting;Taping    PT Next Visit Plan Keep progressing tib ant and peroneal strengthening; NMES +/- DN to R anterior tibialis +/- peroneals to facilitate DF AROM    PT Home Exercise Plan Access Code: [email or web address] (11/15, updated 12/13)    Consulted and Agree with Plan of Care Patient             Patient will benefit from skilled therapeutic intervention in order to improve the following deficits and impairments:  Abnormal gait, Decreased activity tolerance, Decreased balance, Decreased coordination, Decreased endurance, Decreased knowledge of precautions, Decreased mobility, Decreased range of motion, Decreased safety awareness, Decreased strength, Difficulty walking, Impaired perceived functional ability, Impaired flexibility, Impaired tone, Improper body mechanics, Postural dysfunction  Visit Diagnosis: Foot drop, right  Stiffness of right ankle, not elsewhere classified  Muscle weakness (generalized)  Other abnormalities of gait and mobility  Difficulty in walking, not elsewhere classified     Problem List Patient Active Problem List   Diagnosis Date Noted   Uncontrolled type 2 diabetes mellitus with hyperglycemia ([place]) 04/21/2021   Right foot drop 04/21/2021   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   Gallstones 08/15/2020   Left lower quadrant guarding  06/22/2020   Acute pain of right knee 11/05/2018   Pre-hypertension 11/05/2018   Elevated LDL cholesterol level 11/05/2018   Difficulty sleeping 06/20/2017   Prediabetes 02/08/2016   Male hypogonadism 02/08/2016   Snoring 02/06/2016   Apneic episode 02/06/2016   SOB (shortness of breath) 02/06/2016   Obesity 02/06/2016   Deviated nasal septum, congenital 02/06/2016   Vitamin D insufficiency 02/06/2016   Elevated fasting glucose 02/06/2016  Primary osteoarthritis of left knee 01/20/2016   Groin strain 04/22/2014    Artist Pais, PTA 08/23/2021, 9:40 AM  [facility] [address]  [place], [place], [address] Phone: [phone]   Fax:  [phone]  Name: Marcus Henry MRN: [id number] Date of Birth: 12-Aug-1966

## 2021-08-29 ENCOUNTER — Ambulatory Visit: Payer: No Typology Code available for payment source | Attending: Neurology

## 2021-08-29 DIAGNOSIS — R2689 Other abnormalities of gait and mobility: Secondary | ICD-10-CM | POA: Insufficient documentation

## 2021-08-29 DIAGNOSIS — M25671 Stiffness of right ankle, not elsewhere classified: Secondary | ICD-10-CM | POA: Insufficient documentation

## 2021-08-29 DIAGNOSIS — M21371 Foot drop, right foot: Secondary | ICD-10-CM | POA: Insufficient documentation

## 2021-08-29 DIAGNOSIS — M6281 Muscle weakness (generalized): Secondary | ICD-10-CM | POA: Insufficient documentation

## 2021-08-29 DIAGNOSIS — R262 Difficulty in walking, not elsewhere classified: Secondary | ICD-10-CM | POA: Insufficient documentation

## 2021-08-31 ENCOUNTER — Encounter: Payer: No Typology Code available for payment source | Admitting: Diagnostic Neuroimaging

## 2021-09-05 ENCOUNTER — Ambulatory Visit: Payer: No Typology Code available for payment source | Admitting: Physical Therapy

## 2021-09-05 ENCOUNTER — Encounter: Payer: Self-pay | Admitting: Physical Therapy

## 2021-09-05 ENCOUNTER — Other Ambulatory Visit: Payer: Self-pay

## 2021-09-05 DIAGNOSIS — R2689 Other abnormalities of gait and mobility: Secondary | ICD-10-CM

## 2021-09-05 DIAGNOSIS — M21371 Foot drop, right foot: Secondary | ICD-10-CM

## 2021-09-05 DIAGNOSIS — M6281 Muscle weakness (generalized): Secondary | ICD-10-CM | POA: Diagnosis present

## 2021-09-05 DIAGNOSIS — M25671 Stiffness of right ankle, not elsewhere classified: Secondary | ICD-10-CM | POA: Diagnosis present

## 2021-09-05 DIAGNOSIS — R262 Difficulty in walking, not elsewhere classified: Secondary | ICD-10-CM
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
## 2021-10-02 ENCOUNTER — Ambulatory Visit (INDEPENDENT_AMBULATORY_CARE_PROVIDER_SITE_OTHER): Payer: No Typology Code available for payment source | Admitting: Physician Assistant

## 2021-10-02 ENCOUNTER — Other Ambulatory Visit: Payer: Self-pay

## 2021-10-02 ENCOUNTER — Encounter: Payer: Self-pay | Admitting: Physician Assistant

## 2021-10-02 VITALS — BP 134/85 | HR 87 | Ht 74.0 in | Wt 233.0 lb

## 2021-10-02 DIAGNOSIS — M21371 Foot drop, right foot: Secondary | ICD-10-CM

## 2021-10-02 DIAGNOSIS — G8929 Other chronic pain: Secondary | ICD-10-CM | POA: Insufficient documentation

## 2021-10-02 DIAGNOSIS — E1165 Type 2 diabetes mellitus with hyperglycemia: Secondary | ICD-10-CM | POA: Diagnosis not present

## 2021-10-02 DIAGNOSIS — H8112 Benign paroxysmal vertigo, left ear: Secondary | ICD-10-CM

## 2021-10-02 DIAGNOSIS — E559 Vitamin D deficiency, unspecified: Secondary | ICD-10-CM

## 2021-10-02 DIAGNOSIS — Z1329 Encounter for screening for other suspected endocrine disorder: Secondary | ICD-10-CM

## 2021-10-02 DIAGNOSIS — R030 Elevated blood-pressure reading, without diagnosis of hypertension: Secondary | ICD-10-CM

## 2021-10-02 DIAGNOSIS — Z125 Encounter for screening for malignant neoplasm of prostate: Secondary | ICD-10-CM

## 2021-10-02 DIAGNOSIS — D135 Benign neoplasm of extrahepatic bile ducts: Secondary | ICD-10-CM

## 2021-10-02 DIAGNOSIS — R42 Dizziness and giddiness: Secondary | ICD-10-CM

## 2021-10-02 DIAGNOSIS — E7800 Pure hypercholesterolemia, unspecified: Secondary | ICD-10-CM | POA: Diagnosis not present

## 2021-10-02 DIAGNOSIS — Z0000 Encounter for general adult medical examination without abnormal findings: Secondary | ICD-10-CM

## 2021-10-02 DIAGNOSIS — M25511 Pain in right shoulder: Secondary | ICD-10-CM

## 2021-10-02 DIAGNOSIS — E291 Testicular hypofunction: Secondary | ICD-10-CM | POA: Diagnosis not present

## 2021-10-02 LAB — POCT GLYCOSYLATED HEMOGLOBIN (HGB A1C): Hemoglobin A1C: 7 % — AB (ref 4.0–5.6)

## 2021-10-02 MED ORDER — XIGDUO XR 10-1000 MG PO TB24: 1.0000 | ORAL_TABLET | Freq: Every day | ORAL | 0 refills | Status: DC

## 2021-10-02 NOTE — Progress Notes (Addendum)
Subjective:    Patient ID: Marcus Henry, male    DOB: 06-Mar-1966, 56 y.o.   MRN: [id number]  HPI Pt is a 56 yo obese male with T2DM, ampullary adenoma, HLD who presents to the clinic for medication refills and follow up.   Pt is taking his xigduo daily. He is not checking his sugars. He has changed his diet but not exercising. No open sores or wounds. No CP, palpitations, headaches or vision changes. He did wake up dizzy with the room spinning this morning. Not happened before. Denies any sinus pressure, ST, ear pain.   He does have some intermittent right shoulder pain. No problems lifting. Not trying anything to make better. No known injury.   He continues to go to PT for right drop foot it is doing better.   .. Active Ambulatory Problems    Diagnosis Date Noted   Primary osteoarthritis of left knee 01/20/2016   Snoring 02/06/2016   Apneic episode 02/06/2016   SOB (shortness of breath) 02/06/2016   Obesity 02/06/2016   Deviated nasal septum, congenital 02/06/2016   Vitamin D insufficiency 02/06/2016   Elevated fasting glucose 02/06/2016   Prediabetes 02/08/2016   Male hypogonadism 02/08/2016   Acute pain of right knee 11/05/2018   Pre-hypertension 11/05/2018   Elevated LDL cholesterol level 11/05/2018   Left lower quadrant guarding 06/22/2020   Uncontrolled type 2 diabetes mellitus with hyperglycemia ([place]) 04/21/2021   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     Objective:   Physical Exam Vitals reviewed.  Constitutional:      Appearance: Normal appearance. He is obese.  HENT:     Head: Normocephalic and atraumatic.     Right Ear: Tympanic membrane, ear canal and external ear normal. There is  no impacted cerumen.     Left Ear: Tympanic membrane, ear canal and external ear normal. There is no impacted cerumen.     Nose: Nose normal.     Mouth/Throat:     Mouth: Mucous membranes are moist.     Pharynx: No posterior oropharyngeal erythema.  Eyes:     Extraocular Movements: Extraocular movements intact.     Conjunctiva/sclera: Conjunctivae normal.     Pupils: Pupils are equal, round, and reactive to light.  Neck:     Vascular: No carotid bruit.  Cardiovascular:     Rate and Rhythm: Normal rate and regular rhythm.     Pulses: Normal pulses.     Heart sounds: Normal heart sounds.  Pulmonary:     Effort: Pulmonary effort is normal.     Breath sounds: Normal breath sounds.  Musculoskeletal:        General: Normal range of motion.     Cervical back: Normal range of motion.     Right lower leg: No edema.     Left lower leg: No edema.     Comments: Drop foot right:decreased dorsiflexion.   Lymphadenopathy:     Cervical: No cervical adenopathy.  Neurological:     General: No focal deficit present.     Mental Status: He is alert and oriented to person, place, and time.  Psychiatric:        Mood and  Affect: Mood normal.   Marye Round to left with nystagmus.  .. Results for orders placed or performed in visit on 10/02/21  POCT glycosylated hemoglobin (Hb A1C)  Result Value Ref Range   Hemoglobin A1C 7.0 (A) 4.0 - 5.6 %   HbA1c POC (<> result, manual entry)     HbA1c, POC (prediabetic range)     HbA1c, POC (controlled diabetic range)           Assessment & Plan:  .Marland KitchenOluwafemi was seen today for annual exam.  Diagnoses and all orders for this visit:  Uncontrolled type 2 diabetes mellitus with hyperglycemia (HCC) -     COMPLETE METABOLIC PANEL WITH GFR -     POCT glycosylated hemoglobin (Hb A1C) -     Urine Microalbumin w/creat. ratio -     Dapagliflozin-metFORMIN HCl ER (XIGDUO XR) 05-999 MG TB24; Take 1 tablet by mouth daily.  Male hypogonadism  Elevated LDL  cholesterol level -     Lipid Panel w/reflex Direct LDL  Pre-hypertension -     COMPLETE METABOLIC PANEL WITH GFR  Vitamin D insufficiency -     Vitamin D (25 hydroxy)  Thyroid disorder screen -     TSH  Screening PSA (prostate specific antigen) -     PSA  Routine physical examination -     PSA -     TSH -     Lipid Panel w/reflex Direct LDL -     COMPLETE METABOLIC PANEL WITH GFR -     CBC with Differential/Platelet -     Vitamin D (25 hydroxy)  Vertigo  Ampullary adenoma  Right foot drop   A1C is much better at 7.0.  Continue Xigduo and continue to make exercise and diet changes.  BP looks good.  Not on statin. Pt declines for now. Discussed risk.  .. Diabetic Foot Exam - Simple   Simple Foot Form Diabetic Foot exam was performed with the following findings: Yes 10/02/2021  1:29 PM  Visual Inspection No deformities, no ulcerations, no other skin breakdown bilaterally: Yes Sensation Testing Intact to touch and monofilament testing bilaterally: Yes Pulse Check Posterior Tibialis and Dorsalis pulse intact bilaterally: Yes Comments Patient complains of numbness on right foot in toes but has sensation to monofilament testing    Need eye exam.  Covid x2 needs booster.  Needs flu/pneumonia/2nd shingles.  Follow up in 3 months.   Follow up with sports medicine for right shoulder.   Positive dix hallpike to the left.  Epley manuevers given to start.  Do 3 rounds 3 times a day.  If not improving consider vestibular rehab.

## 2021-10-02 NOTE — Patient Instructions (Signed)
Vertigo Vertigo is the feeling that you or your surroundings are moving when they are not. This feeling can come and go at any time. Vertigo often goes away on its own. Vertigo can be dangerous if it occurs while you are doing something thatcould endanger yourself or others, such as driving or operating machinery. Your health care provider will do tests to try to determine the cause of your vertigo. Tests will also help your health care provider decide how best totreat your condition. Follow these instructions at home: Eating and drinking     Dehydration can make vertigo worse. Drink enough fluid to keep your urine pale yellow. Do not drink alcohol. Activity Return to your normal activities as told by your health care provider. Ask your health care provider what activities are safe for you. In the morning, first sit up on the side of the bed. When you feel okay, stand slowly while you hold onto something until you know that your balance is fine. Move slowly. Avoid sudden body or head movements or certain positions, as told by your health care provider. If you have trouble walking or keeping your balance, try using a cane for stability. If you feel dizzy or unstable, sit down right away. Avoid doing any tasks that would cause danger to you or others if vertigo occurs. Avoid bending down if you feel dizzy. Place items in your home so that they are easy for you to reach without bending or leaning over. Do not drive or use machinery if you feel dizzy. General instructions Take over-the-counter and prescription medicines only as told by your health care provider. Keep all follow-up visits. This is important. Contact a health care provider if: Your medicines do not relieve your vertigo or they make it worse. Your condition gets worse or you develop new symptoms. You have a fever. You develop nausea or vomiting, or if nausea gets worse. Your family or friends notice any behavioral changes. You  have numbness or a prickling and tingling sensation in part of your body. Get help right away if you: Are always dizzy or you faint. Develop severe headaches. Develop a stiff neck. Develop sensitivity to light. Have difficulty moving or speaking. Have weakness in your hands, arms, or legs. Have changes in your hearing or vision. These symptoms may represent a serious problem that is an emergency. Do not wait to see if the symptoms will go away. Get medical help right away. Call your local emergency services (911 in the [place] Do not drive yourself to the hospital. Summary Vertigo is the feeling that you or your surroundings are moving when they are not. Your health care provider will do tests to try to determine the cause of your vertigo. Follow instructions for home care. You may be told to avoid certain tasks, positions, or movements. Contact a health care provider if your medicines do not relieve your symptoms, or if you have a fever, nausea, vomiting, or changes in behavior. Get help right away if you have severe headaches or difficulty speaking, or you develop hearing or vision problems. This information is not intended to replace advice given to you by your health care provider. Make sure you discuss any questions you have with your healthcare provider. Document Revised: 07/13/2020 Document Reviewed: 07/13/2020 Elsevier Patient Education  2022 [employer].  

## 2021-10-03 DIAGNOSIS — H8111 Benign paroxysmal vertigo, right ear: Secondary | ICD-10-CM | POA: Insufficient documentation

## 2022-01-29 ENCOUNTER — Telehealth: Payer: Self-pay | Admitting: General Practice

## 2022-01-29 NOTE — Telephone Encounter (Signed)
Transition Care Management Unsuccessful Follow-up Telephone Call  Date of discharge and from where:  01/25/22 from [facility]  Attempts:  1st Attempt  Reason for unsuccessful TCM follow-up call:  Left voice message

## 2022-01-30 NOTE — Telephone Encounter (Signed)
Transition Care Management Unsuccessful Follow-up Telephone Call  Date of discharge and from where:  01/25/22 from [facility]  Attempts:  2nd Attempt  Reason for unsuccessful TCM follow-up call:  Left voice message

## 2022-01-31 NOTE — Telephone Encounter (Signed)
Transition Care Management Unsuccessful Follow-up Telephone Call  Date of discharge and from where:  01/25/22 from [facility]  Attempts:  3rd Attempt  Reason for unsuccessful TCM follow-up call:  Left voice message

## 2022-03-07 ENCOUNTER — Other Ambulatory Visit: Payer: Self-pay | Admitting: Neurology

## 2022-03-07 DIAGNOSIS — E1165 Type 2 diabetes mellitus with hyperglycemia: Secondary | ICD-10-CM

## 2022-03-07 MED ORDER — XIGDUO XR 10-1000 MG PO TB24: 1.0000 | ORAL_TABLET | Freq: Every day | ORAL | 0 refills | Status: DC

## 2022-04-09 ENCOUNTER — Other Ambulatory Visit: Payer: Self-pay | Admitting: Physician Assistant

## 2022-04-09 ENCOUNTER — Other Ambulatory Visit: Payer: Self-pay | Admitting: Neurology

## 2022-04-09 DIAGNOSIS — E1165 Type 2 diabetes mellitus with hyperglycemia: Secondary | ICD-10-CM

## 2022-04-09 MED ORDER — XIGDUO XR 10-1000 MG PO TB24: 1.0000 | ORAL_TABLET | Freq: Every day | ORAL | 0 refills | Status: DC

## 2022-05-01 ENCOUNTER — Other Ambulatory Visit: Payer: Self-pay | Admitting: Physician Assistant

## 2022-05-01 DIAGNOSIS — E1165 Type 2 diabetes mellitus with hyperglycemia: Secondary | ICD-10-CM

## 2022-05-08 ENCOUNTER — Ambulatory Visit (INDEPENDENT_AMBULATORY_CARE_PROVIDER_SITE_OTHER): Payer: No Typology Code available for payment source

## 2022-05-08 ENCOUNTER — Ambulatory Visit (INDEPENDENT_AMBULATORY_CARE_PROVIDER_SITE_OTHER): Payer: No Typology Code available for payment source | Admitting: Physician Assistant

## 2022-05-08 ENCOUNTER — Encounter: Payer: Self-pay | Admitting: Physician Assistant

## 2022-05-08 VITALS — BP 128/86 | HR 69 | Ht 74.0 in | Wt 228.0 lb

## 2022-05-08 DIAGNOSIS — E1165 Type 2 diabetes mellitus with hyperglycemia: Secondary | ICD-10-CM | POA: Diagnosis not present

## 2022-05-08 DIAGNOSIS — G8929 Other chronic pain: Secondary | ICD-10-CM

## 2022-05-08 DIAGNOSIS — M25511 Pain in right shoulder: Secondary | ICD-10-CM | POA: Diagnosis not present

## 2022-05-08 DIAGNOSIS — Z13220 Encounter for screening for lipoid disorders: Secondary | ICD-10-CM

## 2022-05-08 DIAGNOSIS — Z1211 Encounter for screening for malignant neoplasm of colon: Secondary | ICD-10-CM

## 2022-05-08 LAB — POCT GLYCOSYLATED HEMOGLOBIN (HGB A1C): Hemoglobin A1C: 7.1 % — AB (ref 4.0–5.6)

## 2022-05-08 LAB — POCT UA - MICROALBUMIN
Albumin/Creatinine Ratio, Urine, POC: <30
Creatinine, POC: 200 mg/dL
Microalbumin Ur, POC: 10 mg/L

## 2022-05-08 MED ORDER — MELOXICAM 15 MG PO TABS: ORAL_TABLET | ORAL | 2 refills | Status: DC

## 2022-05-08 MED ORDER — XIGDUO XR 10-1000 MG PO TB24: 1.0000 | ORAL_TABLET | Freq: Every day | ORAL | 1 refills | Status: DC

## 2022-05-08 NOTE — Progress Notes (Signed)
Established Patient Office Visit  Subjective   Patient ID: Marcus Henry, male    DOB: 1966-05-17  Age: 56 y.o. MRN: [id number]  Chief Complaint  Patient presents with   Follow-up    HPI Pt is a 56 yo obese male with T2DM who presents for follow up and medication refills.   He does check his sugars daily and range from 120 to 200s. No open sores or wounds. No hypoglycemic events. Denies any CP, palpitations, headaches or vision changes. He is taking his xigduo daily. He has lost 5lbs since feb.  He is staying active and trying to keep a better diet.   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 He is doing fine. Everything turned out well.   He has had right shoulder pain for the last 3 years. No known injury. Not taking medications. Seems to hurt at night more than in the morning. Not effected his strength.   .. Active Ambulatory Problems    Diagnosis Date Noted   Primary osteoarthritis of left knee 01/20/2016   Snoring 02/06/2016   Apneic episode 02/06/2016   SOB (shortness of breath) 02/06/2016   Obesity 02/06/2016   Deviated nasal septum, congenital 02/06/2016   Vitamin D insufficiency 02/06/2016   Elevated fasting glucose 02/06/2016   Prediabetes 02/08/2016   Male hypogonadism 02/08/2016   Acute pain of right knee 11/05/2018   Pre-hypertension 11/05/2018   Elevated LDL cholesterol level 11/05/2018   Left lower quadrant guarding 06/22/2020   Uncontrolled type 2 diabetes mellitus with hyperglycemia ([place]) 04/21/2021   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     Objective:     BP 128/86   Pulse 69   Ht '6\' 2"'$  (1.88 m)   Wt 228 lb (103.4 kg)   SpO2 97%   BMI 29.27 kg/m  BP Readings from Last 3 Encounters:  05/08/22 128/86  10/02/21 134/85  06/08/21 134/79   Wt Readings from Last 3 Encounters:  05/08/22 228 lb (103.4 kg)  10/02/21 233 lb (105.7 kg)  06/08/21 236 lb 8 oz (107.3 kg)      Physical Exam Constitutional:      Appearance: Normal appearance.  HENT:     Head: Normocephalic.  Cardiovascular:     Rate and Rhythm: Normal rate and regular rhythm.  Pulmonary:     Effort: Pulmonary effort is normal.     Breath sounds: Normal breath sounds.  Musculoskeletal:     Right lower leg: No edema.     Left lower leg: No edema.     Comments: No pain to palpation over right shoulder Crepitus with ROM.  Strength 5/5 of upper ext, bilaterally.  No swelling or redness noted.  Pain with external rotation  Neurological:     General: No focal deficit present.     Mental Status: He is alert and oriented to person, place, and time.  Psychiatric:        Mood and Affect: Mood normal.      Results for orders placed or performed in visit on  05/08/22  POCT UA - Microalbumin  Result Value Ref Range   Microalbumin Ur, POC 10 mg/L   Creatinine, POC 200 mg/dL   Albumin/Creatinine Ratio, Urine, POC <30   POCT glycosylated hemoglobin (Hb A1C)  Result Value Ref Range   Hemoglobin A1C 7.1 (A) 4.0 - 5.6 %   HbA1c POC (<> result, manual entry)     HbA1c, POC (prediabetic range)     HbA1c, POC (controlled diabetic range)         Assessment & Plan:  .Marland KitchenEd was seen today for follow-up.  Diagnoses and all orders for this visit:  Uncontrolled type 2 diabetes mellitus with hyperglycemia ([place]) -     POCT UA - Microalbumin -     POCT glycosylated hemoglobin (Hb A1C) -     Dapagliflozin-metFORMIN HCl ER (XIGDUO XR) 05-999 MG TB24; Take 1 tablet by mouth daily.  Chronic right shoulder pain -     meloxicam (MOBIC) 15 MG  tablet; One tab PO every 24 hours with a meal for 2 weeks, then once every 24 hours prn pain. -     DG Shoulder Right; Future  Screening for lipid disorders -     Lipid Panel w/reflex Direct LDL   Chronic right shoulder pain Likely OA Xray ordered Mobic given Make appt for Dr. Darene Lamer for management if not improving  A1C not to goal Continue xigduo Continue to make diet changes Discuss GLP-1 but will hold off until at least 3 months post op from pancreas mass being removed BP to goal on 2nd recheck Not on statin, lipid ordered Declined foot exam and eye exam Declined all vaccines.  Follow up in 3 months.    Return in about 3 months (around 08/07/2022).    Iran Planas, PA-C

## 2022-05-08 NOTE — Patient Instructions (Signed)
Start mobic daily then as needed for joint pain and make appt with Dr. Darene Lamer for right shoulder pain.  Continue on xigduo for sugars.

## 2022-05-09 ENCOUNTER — Encounter: Payer: Self-pay | Admitting: Physician Assistant

## 2022-05-09 LAB — LIPID PANEL W/REFLEX DIRECT LDL
Cholesterol: 189 mg/dL (ref <200)
HDL: 45 mg/dL (ref >=40)
LDL Cholesterol (Calc): 112 mg/dL (calc) — ABNORMAL HIGH
Non-HDL Cholesterol (Calc): 144 mg/dL (calc) — ABNORMAL HIGH (ref <130)
Total CHOL/HDL Ratio: 4.2 (calc) (ref <5.0)
Triglycerides: 208 mg/dL — ABNORMAL HIGH (ref <150)

## 2022-05-09 NOTE — Progress Notes (Signed)
Suprisingly there is not a lot of arthritis on imaging. This means your pain is likely coming from soft tissue inflammation like labrum tear, rotator cuff tendonitis, bursitis. I would do the shoulder exercise given and take the mobic I gave you. If not resolved make appt with Dr. Darene Lamer in sports medicine.

## 2022-05-09 NOTE — Progress Notes (Signed)
Hill,   TG are high and LDL is better than 3 years ago but not to goal. Your LDL goal is under 70 and you are at 112. Your 10 year CV risk is 10.8 percent. I would really like for you to start lipitor to help lower your LDL and CV risk. Thoughts?   Marland KitchenMarland KitchenThe 10-year ASCVD risk score (Arnett DK, et al., 2019) is: 10.8%   Values used to calculate the score:     Age: 56 years     Sex: Male     Is Non-Hispanic African American: No     Diabetic: Yes     Tobacco smoker: No     Systolic Blood Pressure: 263 mmHg     Is BP treated: No     HDL Cholesterol: 45 mg/dL     Total Cholesterol: 189 mg/dL

## 2022-05-09 NOTE — Addendum Note (Signed)
Addended by: Donella Stade on: 05/09/2022 08:55 AM   Modules accepted: Orders

## 2022-07-30 ENCOUNTER — Telehealth: Payer: Self-pay

## 2022-07-30 ENCOUNTER — Ambulatory Visit: Payer: No Typology Code available for payment source | Admitting: Physician Assistant

## 2022-07-30 DIAGNOSIS — E1165 Type 2 diabetes mellitus with hyperglycemia: Secondary | ICD-10-CM

## 2022-07-30 MED ORDER — XIGDUO XR 10-1000 MG PO TB24: 1.0000 | ORAL_TABLET | Freq: Every day | ORAL | 0 refills | Status: DC

## 2022-07-30 NOTE — Telephone Encounter (Signed)
Patient came into office for his appointment, his appointment was canceled due to [facility] being out of office, patient is unable to come into office due to his work schedule, patient is going out of the country until 09/17/22, patient would like to know if he could get his medication Dapagliflozin metformin, please advise, thanks!

## 2022-07-30 NOTE — Telephone Encounter (Signed)
Last seen 3 months ago He did reschedule for January when he returns to the country RX sent.

## 2022-08-04 ENCOUNTER — Other Ambulatory Visit: Payer: Self-pay | Admitting: Physician Assistant

## 2022-08-04 DIAGNOSIS — G8929 Other chronic pain: Secondary | ICD-10-CM

## 2022-08-06 ENCOUNTER — Ambulatory Visit: Payer: No Typology Code available for payment source | Admitting: Physician Assistant

## 2022-09-01 ENCOUNTER — Other Ambulatory Visit: Payer: Self-pay | Admitting: Physician Assistant

## 2022-09-01 DIAGNOSIS — G8929 Other chronic pain: Secondary | ICD-10-CM

## 2022-09-19 ENCOUNTER — Encounter: Payer: Self-pay | Admitting: Physician Assistant

## 2022-09-19 ENCOUNTER — Ambulatory Visit (INDEPENDENT_AMBULATORY_CARE_PROVIDER_SITE_OTHER): Payer: No Typology Code available for payment source | Admitting: Physician Assistant

## 2022-09-19 VITALS — BP 123/70 | HR 84 | Ht 74.0 in | Wt 224.4 lb

## 2022-09-19 DIAGNOSIS — E1165 Type 2 diabetes mellitus with hyperglycemia: Secondary | ICD-10-CM | POA: Diagnosis not present

## 2022-09-19 DIAGNOSIS — Z1211 Encounter for screening for malignant neoplasm of colon: Secondary | ICD-10-CM

## 2022-09-19 DIAGNOSIS — Z5320 Procedure and treatment not carried out because of patient's decision for unspecified reasons: Secondary | ICD-10-CM | POA: Diagnosis not present

## 2022-09-19 LAB — POCT GLYCOSYLATED HEMOGLOBIN (HGB A1C): Hemoglobin A1C: 7.6 % — AB (ref 4.0–5.6)

## 2022-09-19 MED ORDER — XIGDUO XR 10-1000 MG PO TB24: 1.0000 | ORAL_TABLET | Freq: Every day | ORAL | 0 refills | Status: DC

## 2022-09-19 NOTE — Progress Notes (Signed)
Established Patient Office Visit  Subjective   Patient ID: Marcus Henry, male    DOB: Aug 06, 1966  Age: 57 y.o. MRN: [id number]  Chief Complaint  Patient presents with   Follow-up    HPI Pt is a 57 yo male with T2DM, dyslipidemia who presents to the clinic for follow up.   He has been on vacation with son in [place] for 6 weeks. He has not been checking sugars. He has taken xigduo. He is doing well. No CP, palpitations, headaches, vision changes. Drop foot has resolved with PT. No hypoglycemic events. No open sores or wounds.   Active Ambulatory Problems    Diagnosis Date Noted   Primary osteoarthritis of left knee 01/20/2016   Snoring 02/06/2016   Apneic episode 02/06/2016   SOB (shortness of breath) 02/06/2016   Obesity 02/06/2016   Deviated nasal septum, congenital 02/06/2016   Vitamin D insufficiency 02/06/2016   Elevated fasting glucose 02/06/2016   Prediabetes 02/08/2016   Male hypogonadism 02/08/2016   Acute pain of right knee 11/05/2018   Pre-hypertension 11/05/2018   Dyslipidemia, goal LDL below 70 11/05/2018   Left lower quadrant guarding 06/22/2020   Uncontrolled type 2 diabetes mellitus with hyperglycemia ([place]) 04/21/2021   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     Objective:     BP 123/70 (BP Location: Left Arm, Patient Position: Sitting, Cuff Size: Large)   Pulse 84   Ht '6\' 2"'$  (1.88 m)   Wt 224 lb 6.4 oz (101.8 kg)   SpO2 95%   BMI 28.81 kg/m  BP Readings from Last 3 Encounters:  09/19/22 123/70   05/08/22 128/86  10/02/21 134/85   Wt Readings from Last 3 Encounters:  09/19/22 224 lb 6.4 oz (101.8 kg)  05/08/22 228 lb (103.4 kg)  10/02/21 233 lb (105.7 kg)      Physical Exam Constitutional:      Appearance: Normal appearance.  Neck:     Vascular: No carotid bruit.  Cardiovascular:     Rate and Rhythm: Normal rate and regular rhythm.  Pulmonary:     Effort: Pulmonary effort is normal.     Breath sounds: Normal breath sounds.  Musculoskeletal:     Right lower leg: No edema.     Left lower leg: No edema.  Neurological:     General: No focal deficit present.     Mental Status: He is alert and oriented to person, place, and time.  Psychiatric:        Mood and Affect: Mood normal.      Results for orders placed or performed in visit on 09/19/22  POCT glycosylated hemoglobin (Hb A1C)  Result Value Ref Range   Hemoglobin A1C 7.6 (A) 4.0 - 5.6 %   HbA1c POC (<> result, manual entry)     HbA1c, POC (prediabetic range)     HbA1c, POC (controlled diabetic range)      The 10-year ASCVD risk score (Arnett DK, et al., 2019) is: 10.1%    Assessment & Plan:  Marland KitchenMarland Kitchen  Nikash was seen today for follow-up.  Diagnoses and all orders for this visit:  Uncontrolled type 2 diabetes mellitus with hyperglycemia ([place]) -     POCT glycosylated hemoglobin (Hb A1C) -     Dapagliflozin Pro-metFORMIN ER (XIGDUO XR) 05-999 MG TB24; Take 1 tablet by mouth daily.  Statin declined  Colon cancer screening -     Cologuard   A1C not to goal  Pt has been on vacation for 6 weeks Refilled xigduo Declines adding any medication at this time Discussed DM diet and exercise LDL not to goal, pt declined statin right now, he is aware of risk. BP to goal.  Declined all vaccines.  Follow up in 3 months.   Discussed need for colon cancer screening, agreed to cologuard.      Return in about 3 months (around 12/19/2022).    Iran Planas, PA-C

## 2022-10-23 ENCOUNTER — Ambulatory Visit (INDEPENDENT_AMBULATORY_CARE_PROVIDER_SITE_OTHER): Payer: No Typology Code available for payment source | Admitting: Physician Assistant

## 2022-10-23 ENCOUNTER — Encounter: Payer: Self-pay | Admitting: Physician Assistant

## 2022-10-23 VITALS — BP 143/90 | HR 69 | Ht 74.0 in | Wt 226.0 lb

## 2022-10-23 DIAGNOSIS — L0291 Cutaneous abscess, unspecified: Secondary | ICD-10-CM

## 2022-10-23 MED ORDER — DOXYCYCLINE HYCLATE 100 MG PO TABS: 100.0000 mg | ORAL_TABLET | Freq: Two times a day (BID) | ORAL | 0 refills | Status: DC

## 2022-10-23 NOTE — Patient Instructions (Signed)
Skin Abscess  A skin abscess is an infected area on or under your skin. It contains pus and other material. An abscess may also be called a furuncle, carbuncle, or boil. It is often the result of an infection caused by bacteria. An abscess can occur in or on almost any part of your body. Sometimes, an abscess may break open (rupture) on its own. In most cases, it will keep getting worse unless it is treated. An abscess can cause pain and make you feel ill. An untreated abscess can cause infection to spread to other parts of your body or your bloodstream. The abscess may need to be drained. You may also need to take antibiotics. What are the causes? An abscess occurs when germs, like bacteria, pass through your skin and cause an infection. This may be caused by: A scrape or cut on your skin. A puncture wound through your skin, such as a needle injection or insect bite. Blocked oil or sweat glands. Blocked and infected hair follicles. A fluid-filled sac that forms beneath your skin (sebaceous cyst) and becomes infected. What increases the risk? You may be more likely to develop an abscess if: You have problems with blood circulation, or you have a weak body defense system (immune system). You have diabetes. You have dry and irritated skin. You get injections often or use IV drugs. You have a foreign body in a wound, such as a splinter. You smoke or use tobacco products. What are the signs or symptoms? Symptoms of this condition include: A painful, firm bump under the skin. A bump with pus at the top. This may break through the skin and drain. Other symptoms include: Redness and swelling around the abscess. Warmth or tenderness. Swelling of the lymph nodes (glands) near the abscess. A sore on the skin. How is this diagnosed? This condition may be diagnosed based on a physical exam and your medical history. You may also have tests done, such as: A test of a sample of pus. This may be done  to find what is causing the infection. Blood tests. Imaging tests, such as an ultrasound, CT scan, or MRI. How is this treated? A small abscess that drains on its own may not need to be treated. Treatment for larger abscesses may include: Moist heat or a heat pack applied to the area a few times a day. Incision and drainage. This is a procedure to drain the abscess. Antibiotics. For a severe abscess, you may first get antibiotics through an IV and then change to antibiotics by mouth. Follow these instructions at home: Medicines Take over-the-counter and prescription medicines only as told by your provider. If you were prescribed antibiotics, take them as told by your provider. Do not stop using the antibiotic even if you start to feel better. Abscess care  If you have an abscess that has not drained, apply heat to the affected area. Use the heat source that your provider recommends, such as a moist heat pack or a heating pad. Place a towel between your skin and the heat source. Leave the heat on for 20-30 minutes at a time. If your skin turns bright red, remove the heat right away to prevent burns. The risk of burns is higher if you cannot feel pain, heat, or cold. Follow instructions from your provider about how to take care of your abscess. Make sure you: Cover the abscess with a bandage (dressing). Wash your hands with soap and water for at least 20 seconds before  and after you change the dressing or gauze. If soap and water are not available, use hand sanitizer. Change your dressing or gauze as told by your provider. Check your abscess every day for signs of an infection that is getting worse. Check for: More redness, swelling, pain, or tenderness. More fluid or blood. Warmth. More pus or a worse smell. General instructions To avoid spreading the infection: Do not share personal care items, towels, or hot tubs with others. Avoid making skin contact with other people. Be careful  when getting rid of used dressings, wound packing, or any drainage from the abscess. Do not use any products that contain nicotine or tobacco. These products include cigarettes, chewing tobacco, and vaping devices, such as e-cigarettes. If you need help quitting, ask your provider. Do not use any creams, ointments, or liquids unless you have been told to by your provider. Contact a health care provider if: You see redness that spreads quickly or red streaks on your skin spreading away from the abscess. You have any signs of worse infection at the abscess. You vomit every time you eat or drink. You have a fever, chills, or muscle aches. The cyst or abscess returns. Get help right away if: You have severe pain. You make less pee (urine) than normal. This information is not intended to replace advice given to you by your health care provider. Make sure you discuss any questions you have with your health care provider. Document Revised: 03/28/2022 Document Reviewed: 03/28/2022 Elsevier Patient Education  [place].

## 2022-10-23 NOTE — Progress Notes (Signed)
   Acute Office Visit  Subjective:     Patient ID: Marcus Henry, male    DOB: November 30, 1965, 57 y.o.   MRN: [id number]  Chief Complaint  Patient presents with   Mass    HPI Patient is in today for 6 days of painful skin lump just under his right arm pit. He has never had anything like this before. Started draining 3 days ago. He has not done anything to make better. Denies any fever, chills, body aches. It is very tender to touch.   .. Active Ambulatory Problems    Diagnosis Date Noted   Primary osteoarthritis of left knee 01/20/2016   Snoring 02/06/2016   Apneic episode 02/06/2016   SOB (shortness of breath) 02/06/2016   Obesity 02/06/2016   Deviated nasal septum, congenital 02/06/2016   Vitamin D insufficiency 02/06/2016   Elevated fasting glucose 02/06/2016   Prediabetes 02/08/2016   Male hypogonadism 02/08/2016   Acute pain of right knee 11/05/2018   Pre-hypertension 11/05/2018   Dyslipidemia, goal LDL below 70 11/05/2018   Left lower quadrant guarding 06/22/2020   Uncontrolled type 2 diabetes mellitus with hyperglycemia ([place]) 04/21/2021   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   Gallstones 08/15/2020   Difficulty sleeping 06/20/2017   Groin strain 04/22/2014   Right foot drop 04/21/2021   Chronic right shoulder pain 10/02/2021   BPPV (benign paroxysmal positional vertigo), right 10/03/2021   Statin declined 09/19/2022   Resolved Ambulatory Problems    Diagnosis Date Noted   No energy 02/06/2016   Past Medical History:  Diagnosis Date   Hypertension      ROS  See HPI.     Objective:    BP (!) 143/90   Pulse 69   Ht 6' 2"$  (1.88 m)   Wt 226 lb (102.5 kg)   SpO2 97%   BMI 29.02 kg/m  BP Readings from Last 3 Encounters:  10/23/22 (!) 143/90  09/19/22 123/70  05/08/22 128/86   Wt Readings from Last 3 Encounters:  10/23/22 226 lb (102.5 kg)  09/19/22 224 lb 6.4 oz (101.8 kg)  05/08/22 228 lb (103.4 kg)      Physical Exam  3cm by 2cm abscess with scant  yellow drainage. Lots of erythema and induration with tenderness to palpation.       Assessment & Plan:  .Marland KitchenTayvion was seen today for mass.  Diagnoses and all orders for this visit:  Abscess -     doxycycline (VIBRA-TABS) 100 MG tablet; Take 1 tablet (100 mg total) by mouth 2 (two) times daily.   Start with doxycycline and warm compresses Follow up in 7 days or sooner if not improving or worsening May need I and D if not improving HO given Tylenol and ibuprofen for pain Return in about 1 week (around 10/30/2022) for abscess follow up.  Iran Planas, PA-C

## 2022-10-31 ENCOUNTER — Encounter: Payer: Self-pay | Admitting: Physician Assistant

## 2022-10-31 ENCOUNTER — Ambulatory Visit (INDEPENDENT_AMBULATORY_CARE_PROVIDER_SITE_OTHER): Payer: No Typology Code available for payment source | Admitting: Physician Assistant

## 2022-10-31 VITALS — BP 123/84 | HR 88

## 2022-10-31 DIAGNOSIS — L0291 Cutaneous abscess, unspecified: Secondary | ICD-10-CM

## 2022-10-31 DIAGNOSIS — R42 Dizziness and giddiness: Secondary | ICD-10-CM | POA: Diagnosis not present

## 2022-10-31 NOTE — Progress Notes (Signed)
Established Patient Office Visit  Subjective   Patient ID: Marcus Henry, male    DOB: 1966/01/10  Age: 57 y.o. MRN: [id number]  Chief Complaint  Patient presents with   Follow-up    HPI Pt is a 57 yo male who presents to the clinic to follow up on abscess. He has been taking doxycycline and doing well. Abscess is improving. No fever, chills, body aches. He does mention some dizziness while working 3rd shift tonight. Not ate or drank much today.   .. Active Ambulatory Problems    Diagnosis Date Noted   Primary osteoarthritis of left knee 01/20/2016   Snoring 02/06/2016   Apneic episode 02/06/2016   SOB (shortness of breath) 02/06/2016   Obesity 02/06/2016   Deviated nasal septum, congenital 02/06/2016   Vitamin D insufficiency 02/06/2016   Elevated fasting glucose 02/06/2016   Prediabetes 02/08/2016   Male hypogonadism 02/08/2016   Acute pain of right knee 11/05/2018   Pre-hypertension 11/05/2018   Dyslipidemia, goal LDL below 70 11/05/2018   Left lower quadrant guarding 06/22/2020   Uncontrolled type 2 diabetes mellitus with hyperglycemia ([place]) 04/21/2021   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   Gallstones 08/15/2020   Difficulty sleeping 06/20/2017   Groin strain 04/22/2014   Right foot drop 04/21/2021   Chronic right shoulder pain 10/02/2021   BPPV (benign paroxysmal positional vertigo), right 10/03/2021   Statin declined 09/19/2022   Resolved Ambulatory Problems    Diagnosis Date Noted   No energy 02/06/2016   Past Medical History:  Diagnosis Date   Hypertension      ROS See HPI.    Objective:     BP 123/84   Pulse 88   SpO2 96%  BP Readings from Last 3 Encounters:  10/31/22 123/84  10/23/22 (!) 143/90  09/19/22 123/70   Wt Readings from Last 3 Encounters:  10/23/22 226 lb (102.5 kg)  09/19/22 224 lb 6.4 oz (101.8 kg)  05/08/22 228 lb (103.4 kg)      Physical Exam Constitutional:      Appearance: Normal appearance.  HENT:     Head:  Normocephalic.     Right Ear: Tympanic membrane, ear canal and external ear normal. There is no impacted cerumen.     Left Ear: Tympanic membrane, ear canal and external ear normal. There is no impacted cerumen.  Cardiovascular:     Rate and Rhythm: Normal rate.  Pulmonary:     Effort: Pulmonary effort is normal.  Musculoskeletal:     Right lower leg: No edema.     Left lower leg: No edema.  Skin:    Comments: Right abscess healing with some scant drainage and 12m hole.   Neurological:     General: No focal deficit present.     Mental Status: He is alert and oriented to person, place, and time.     Gait: Gait normal.     Comments: Negative dix hallipike in office today  Psychiatric:        Mood and Affect: Mood normal.     Cleaned out central hole and placed topical ointment and bandage    Assessment & Plan:  ..Marland KitchenRaheelwas seen today for follow-up.  Diagnoses and all orders for this visit:  Abscess  Dizziness   Abscess is healing continue doxycycline Keep wound clean and dry and covered Follow up as needed  Negative dix hallpike could be not enough hydration or food Make sure to eat small frequent meals all day long Follow up as needed  or if symptoms worsen or persist  Iran Planas, PA-C

## 2022-10-31 NOTE — Patient Instructions (Signed)
Keep clean and covered and finish doxycycline.

## 2022-11-21 IMAGING — DX DG TIBIA/FIBULA 2V*L*
4 series · 4 of 4 positions shown · non-contrast
Comparison: None.

CLINICAL DATA: Right leg pain.

EXAM:
LEFT TIBIA AND FIBULA - 2 VIEW

[tibia ap (1 of 2)]
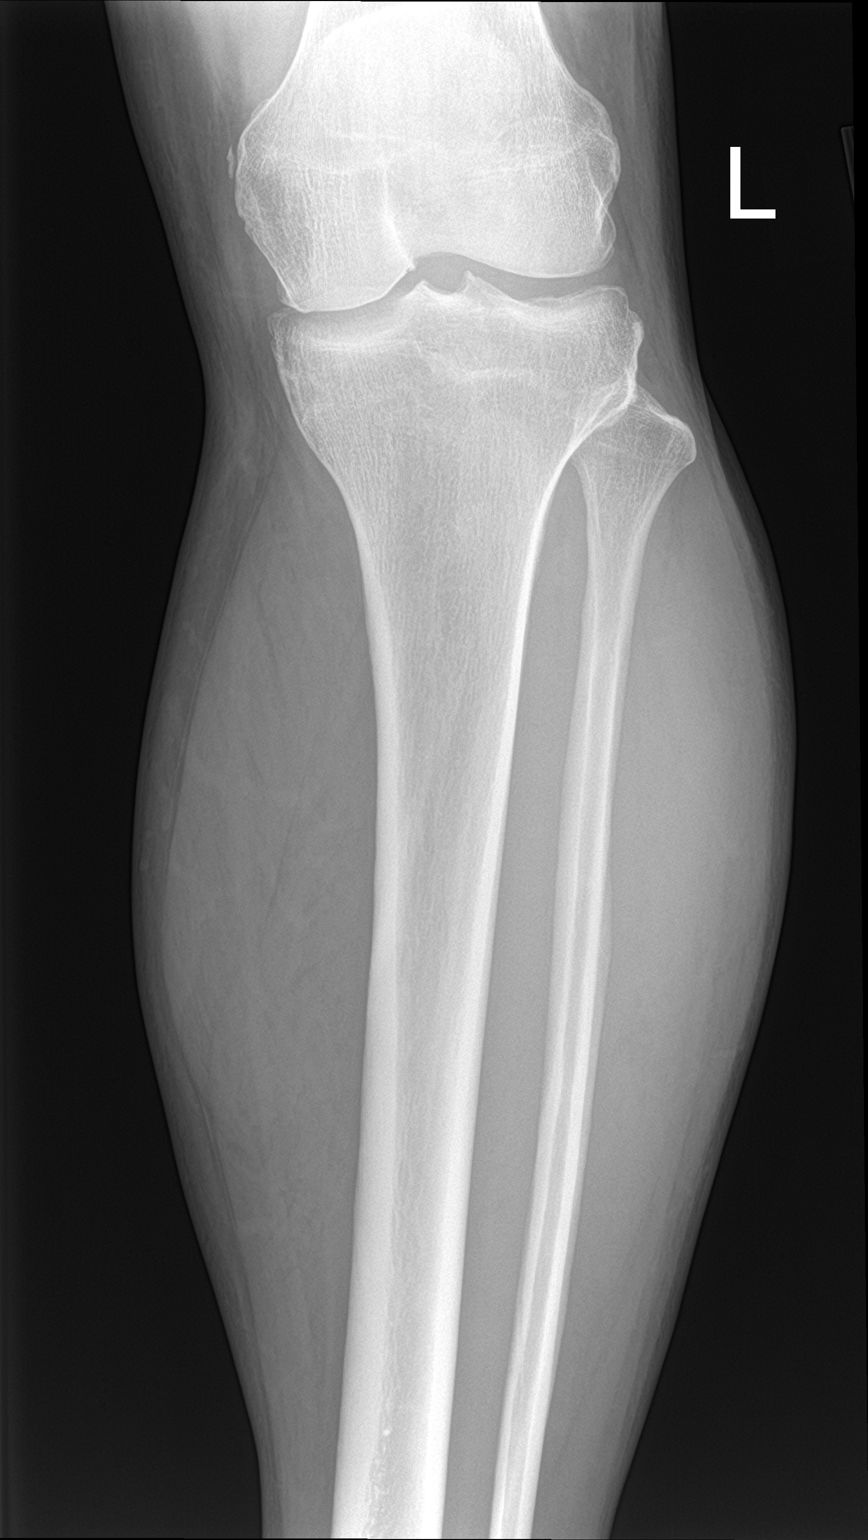

[tibia ap (2 of 2)]
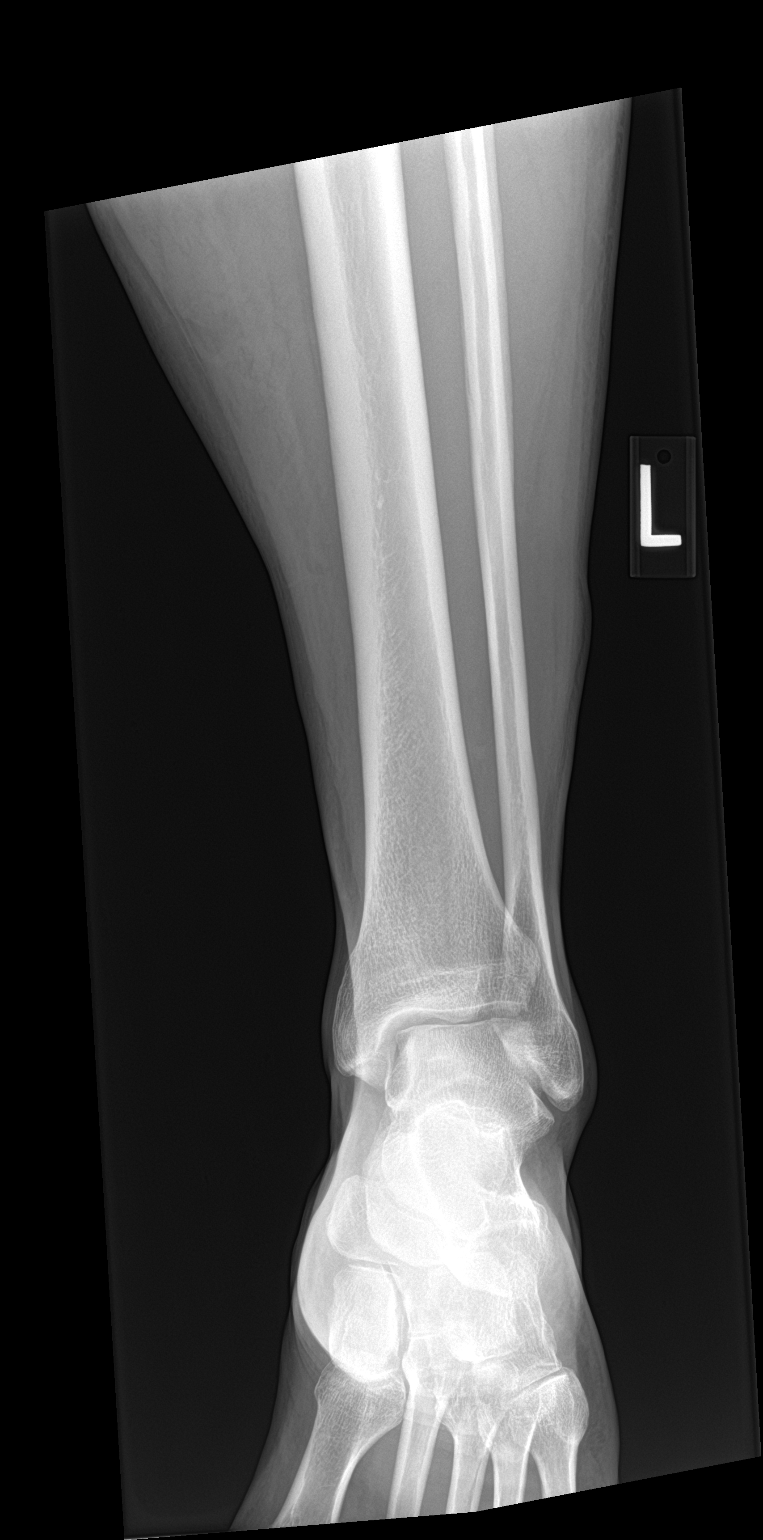

[tibia lat (1 of 2)]
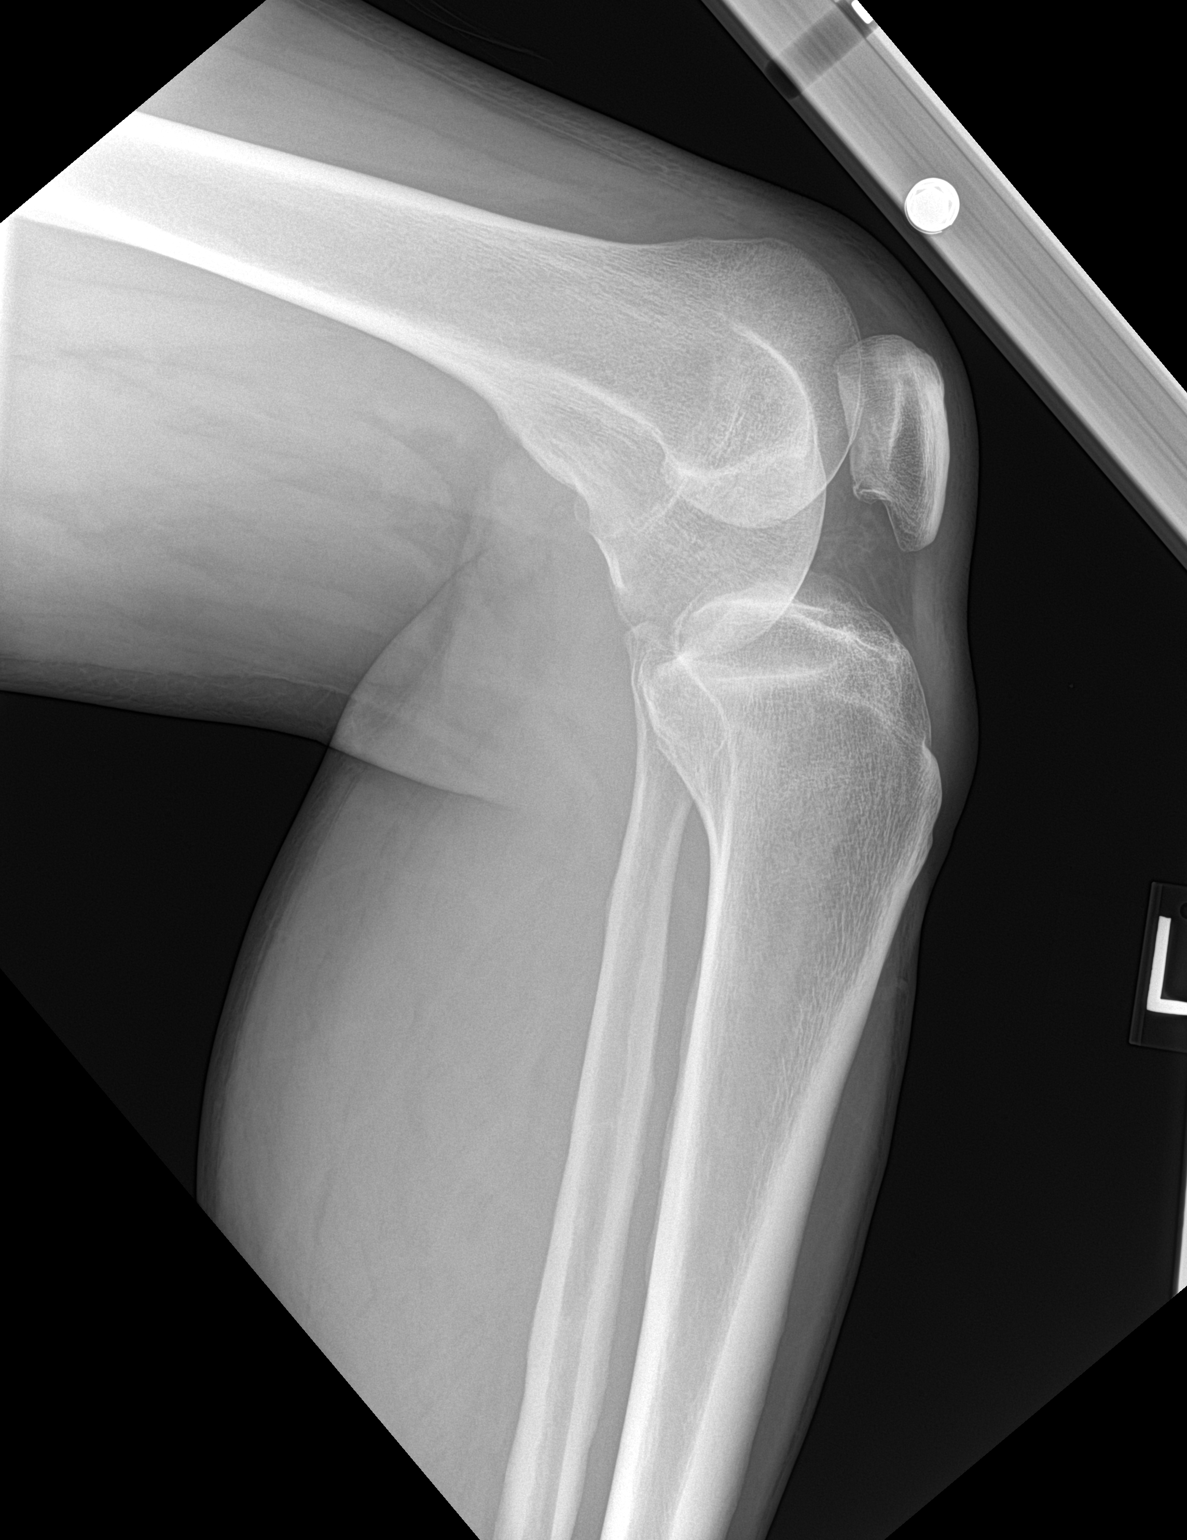

[tibia lat (2 of 2)]
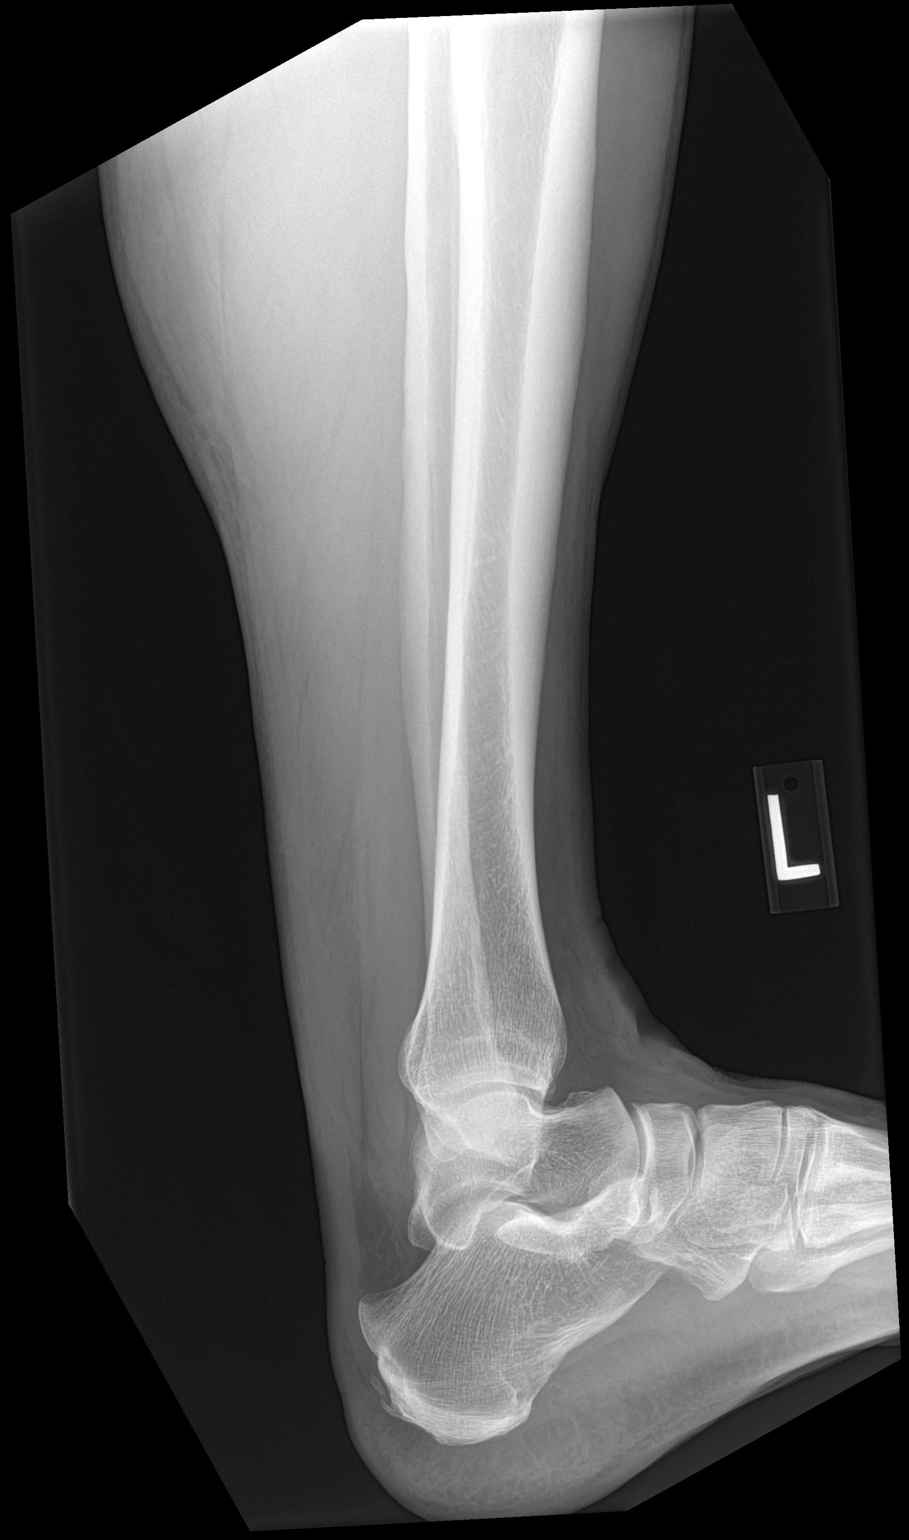

[4 of 4 positions shown; findings below may reference images not displayed]

FINDINGS: There is no evidence of fracture or other focal bone lesions. Soft
tissues are unremarkable.
IMPRESSION: Negative.

## 2023-01-01 ENCOUNTER — Other Ambulatory Visit: Payer: Self-pay | Admitting: Physician Assistant

## 2023-01-01 DIAGNOSIS — E1165 Type 2 diabetes mellitus with hyperglycemia: Secondary | ICD-10-CM

## 2023-01-02 MED ORDER — DAPAGLIFLOZIN PRO-METFORMIN ER 10-1000 MG PO TB24: 1.0000 | ORAL_TABLET | Freq: Every day | ORAL | 0 refills | Status: DC

## 2023-02-12 ENCOUNTER — Other Ambulatory Visit: Payer: Self-pay | Admitting: Physician Assistant

## 2023-02-12 DIAGNOSIS — E1165 Type 2 diabetes mellitus with hyperglycemia: Secondary | ICD-10-CM

## 2023-02-12 MED ORDER — DAPAGLIFLOZIN PRO-METFORMIN ER 10-1000 MG PO TB24
1.0000 | ORAL_TABLET | Freq: Every day | ORAL | 0 refills | Status: DC
Start: 2023-02-12 — End: 2023-03-06

## 2023-03-06 ENCOUNTER — Other Ambulatory Visit: Payer: Self-pay | Admitting: Physician Assistant

## 2023-03-06 DIAGNOSIS — E1165 Type 2 diabetes mellitus with hyperglycemia: Secondary | ICD-10-CM

## 2023-03-06 MED ORDER — DAPAGLIFLOZIN PRO-METFORMIN ER 10-1000 MG PO TB24
1.0000 | ORAL_TABLET | Freq: Every day | ORAL | 0 refills | Status: DC
Start: 2023-03-06 — End: 2023-03-13

## 2023-03-11 ENCOUNTER — Other Ambulatory Visit: Payer: Self-pay | Admitting: Physician Assistant

## 2023-03-11 DIAGNOSIS — E1165 Type 2 diabetes mellitus with hyperglycemia: Secondary | ICD-10-CM

## 2023-03-13 ENCOUNTER — Ambulatory Visit (INDEPENDENT_AMBULATORY_CARE_PROVIDER_SITE_OTHER): Payer: No Typology Code available for payment source | Admitting: Physician Assistant

## 2023-03-13 VITALS — BP 121/80 | HR 80 | Ht 74.0 in | Wt 218.0 lb

## 2023-03-13 DIAGNOSIS — Z5320 Procedure and treatment not carried out because of patient's decision for unspecified reasons: Secondary | ICD-10-CM

## 2023-03-13 DIAGNOSIS — E785 Hyperlipidemia, unspecified: Secondary | ICD-10-CM | POA: Diagnosis not present

## 2023-03-13 DIAGNOSIS — E1165 Type 2 diabetes mellitus with hyperglycemia: Secondary | ICD-10-CM

## 2023-03-13 LAB — POCT GLYCOSYLATED HEMOGLOBIN (HGB A1C): Hemoglobin A1C: 7.2 % — AB (ref 4.0–5.6)

## 2023-03-13 MED ORDER — RYBELSUS 7 MG PO TABS
7.0000 mg | ORAL_TABLET | Freq: Every day | ORAL | 0 refills | Status: DC
Start: 2023-04-13 — End: 2023-06-12

## 2023-03-13 MED ORDER — RYBELSUS 3 MG PO TABS
3.0000 mg | ORAL_TABLET | Freq: Every day | ORAL | 0 refills | Status: AC
Start: 2023-03-13 — End: ?

## 2023-03-13 MED ORDER — DAPAGLIFLOZIN PRO-METFORMIN ER 10-1000 MG PO TB24
1.0000 | ORAL_TABLET | Freq: Every day | ORAL | 0 refills | Status: DC
Start: 2023-03-13 — End: 2023-09-16

## 2023-03-13 NOTE — Progress Notes (Signed)
Established Patient Office Visit  Subjective   Patient ID: Marcus Henry, male    DOB: 1966-07-28  Age: 57 y.o. MRN: [id number]  Chief Complaint  Patient presents with   Follow-up    HPI Pt is a 57 yo male with T2DM, hypogonadism who presents to the clinic for follow up.   Pt is doing ok. He is not checking his sugars regularly. He does report some high readings at times(150s) but then sometimes under 110. He is taking xigduo daily. He was out of if for a few weeks. No open sores or wounds. No CP, palpitations, headaches, or vision changes. Last A1c was 7.6. he is active with work but does not exercise.   .. Active Ambulatory Problems    Diagnosis Date Noted   Primary osteoarthritis of left knee 01/20/2016   Snoring 02/06/2016   Apneic episode 02/06/2016   SOB (shortness of breath) 02/06/2016   Obesity 02/06/2016   Deviated nasal septum, congenital 02/06/2016   Vitamin D insufficiency 02/06/2016   Elevated fasting glucose 02/06/2016   Prediabetes 02/08/2016   Male hypogonadism 02/08/2016   Acute pain of right knee 11/05/2018   Pre-hypertension 11/05/2018   Dyslipidemia, goal LDL below 70 11/05/2018   Left lower quadrant guarding 06/22/2020   Uncontrolled type 2 diabetes mellitus with hyperglycemia ([facility]) 04/21/2021   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     Objective:     BP 121/80   Pulse 80   Ht 6\' 2"  (1.88 m)   Wt 218 lb (98.9 kg)   SpO2 99%   BMI 27.99 kg/m  BP Readings from Last 3 Encounters:  03/13/23 121/80   10/31/22 123/84  10/23/22 (!) 143/90   Wt Readings from Last 3 Encounters:  03/13/23 218 lb (98.9 kg)  10/23/22 226 lb (102.5 kg)  09/19/22 224 lb 6.4 oz (101.8 kg)      .Marland Kitchen Lab Results  Component Value Date   HGBA1C 7.2 (A) 03/13/2023    Physical Exam Constitutional:      Appearance: Normal appearance.  HENT:     Head: Normocephalic.  Cardiovascular:     Rate and Rhythm: Normal rate and regular rhythm.  Pulmonary:     Effort: Pulmonary effort is normal.     Breath sounds: Normal breath sounds.  Musculoskeletal:     Right lower leg: No edema.     Left lower leg: No edema.  Neurological:     General: No focal deficit present.     Mental Status: He is alert and oriented to person, place, and time.  Psychiatric:        Mood and Affect: Mood normal.       The 10-year ASCVD risk score (Arnett DK, et al., 2019) is: 10.7%    Assessment & Plan:  .Marland KitchenPhinehas was seen today for follow-up.  Diagnoses and all orders for this visit:  Uncontrolled type 2 diabetes mellitus with hyperglycemia ([facility]) -     POCT HgB A1C -     Dapagliflozin Pro-metFORMIN ER (XIGDUO  XR) 05-999 MG TB24; Take 1 tablet by mouth daily. -     Semaglutide (RYBELSUS) 3 MG TABS; Take 1 tablet (3 mg total) by mouth daily. -     Semaglutide (RYBELSUS) 7 MG TABS; Take 1 tablet (7 mg total) by mouth daily.  Dyslipidemia, goal LDL below 70  Statin declined   A1c not to goal but has improved Continue on xigduo and add rybelsus Gave HO and discussed side effects BP to goal Declines statin 01/22/2023 kidney function within normal limits Needs eye exam .. Diabetic Foot Exam - Simple   Simple Foot Form  03/13/2023 11:00 AM  Visual Inspection No deformities, no ulcerations, no other skin breakdown bilaterally: Yes Sensation Testing See comments: Yes Pulse Check Posterior Tibialis and Dorsalis pulse intact bilaterally: Yes Comments Sensation diminished bilateral feet.     Declines vaccines Follow  up in 3 months     Return in about 3 months (around 06/13/2023).    Tandy Gaw, PA-C

## 2023-03-13 NOTE — Patient Instructions (Addendum)
Semaglutide Tablets What is this medication? SEMAGLUTIDE (SEM a GLOO tide) treats type 2 diabetes. It works by increasing insulin levels in your body, which decreases your blood sugar (glucose). It also reduces the amount of sugar released into the blood and slows down your digestion. Changes to diet and exercise are often combined with this medication. This medicine may be used for other purposes; ask your health care provider or pharmacist if you have questions. COMMON BRAND NAME(S): Rybelsus What should I tell my care team before I take this medication? They need to know if you have any of these conditions: Endocrine tumors (MEN 2) or if someone in your family had these tumors Eye disease History of pancreatitis Kidney disease Stomach or intestine problems Thyroid cancer or if someone in your family had thyroid cancer Vision problems An unusual or allergic reaction to semaglutide, other medications, foods, dyes, or preservatives Pregnant or trying to get pregnant Breast-feeding How should I use this medication? Take this medication by mouth. Take it as directed on the prescription label at the same time every day. Take the dose right after waking up. Do not eat or drink anything before taking it. Do not take it with any other drink except a glass of plain water that is less than 4 ounces (less than 120 mL). Do not cut, crush or chew this medication. Swallow the tablets whole. After taking it, do not eat breakfast, drink, or take any other medications or vitamins for at least 30 minutes. Keep taking it unless your care team tells you to stop. A special MedGuide will be given to you by the pharmacist with each prescription and refill. Be sure to read this information carefully each time. Talk to your care team about the use of this medication in children. Special care may be needed. Overdosage: If you think you have taken too much of this medicine contact a poison control center or emergency  room at once. NOTE: This medicine is only for you. Do not share this medicine with others. What if I miss a dose? If you miss a dose, skip it. Take your next dose at the normal time. Do not take extra or 2 doses at the same time to make up for the missed dose. What may interact with this medication? What may interact with this medication? Aminophylline Carbamazepine Cyclosporine Digoxin Levothyroxine Other medications for diabetes Phenytoin Tacrolimus Theophylline Warfarin Many medications may cause changes in blood sugar, these include: Alcohol containing beverages Antiviral medications for HIV or AIDS Aspirin and aspirin-like medications Certain medications for blood pressure, heart disease, irregular heart beat Chromium Diuretics Male hormones, such as estrogens or progestins, birth control pills Fenofibrate Gemfibrozil Isoniazid Lanreotide Male hormones or anabolic steroids MAOIs like Carbex, Eldepryl, Marplan, Nardil, and Parnate Medications for weight loss Medications for allergies, asthma, cold, or cough Medications for depression, anxiety, or psychotic disturbances Niacin Nicotine NSAIDs, medications for pain and inflammation, like ibuprofen or naproxen Octreotide Pasireotide Pentamidine Phenytoin Probenecid Quinolone antibiotics such as ciprofloxacin, levofloxacin, ofloxacin Some herbal dietary supplements Steroid medications such as prednisone or cortisone Sulfamethoxazole; trimethoprim Thyroid hormones Some medications can hide the warning symptoms of low blood sugar (hypoglycemia). You may need to monitor your blood sugar more closely if you are taking one of these medications. These include: Beta-blockers, often used for high blood pressure or heart problems (examples include atenolol, metoprolol, propranolol) Clonidine Guanethidine Reserpine This list may not describe all possible interactions. Give your health care provider a list of all the  medicines, herbs, non-prescription drugs, or dietary supplements you use. Also tell them if you smoke, drink alcohol, or use illegal drugs. Some items may interact with your medicine. What should I watch for while using this medication? Visit your care team for regular checks on your progress. Check with your care team if you have severe diarrhea, nausea, and vomiting, or if you sweat a lot. The loss of too much body fluid may make it dangerous for you to take this medication. A test called the HbA1C (A1C) will be monitored. This is a simple blood test. It measures your blood sugar control over the last 2 to 3 months. You will receive this test every 3 to 6 months. Learn how to check your blood sugar. Learn the symptoms of low and high blood sugar and how to manage them. Always carry a quick-source of sugar with you in case you have symptoms of low blood sugar. Examples include hard sugar candy or glucose tablets. Make sure others know that you can choke if you eat or drink when you develop serious symptoms of low blood sugar, such as seizures or unconsciousness. Get medical help at once. Tell your care team if you have high blood sugar. You might need to change the dose of your medication. If you are sick or exercising more than usual, you might need to change the dose of your medication. Do not skip meals. Ask your care team if you should avoid alcohol. Many nonprescription cough and cold products contain sugar or alcohol. These can affect blood sugar. Wear a medical ID bracelet or chain. Carry a card that describes your condition. List the medications and doses you take on the card. What side effects may I notice from receiving this medication? Side effects that you should report to your care team as soon as possible: Allergic reactions--skin rash, itching, hives, swelling of the face, lips, tongue, or throat Change in vision Dehydration--increased thirst, dry mouth, feeling faint or lightheaded,  headache, dark yellow or brown urine Gallbladder problems--severe stomach pain, nausea, vomiting, fever Heart palpitations--rapid, pounding, or irregular heartbeat Kidney injury--decrease in the amount of urine, swelling of the ankles, hands, or feet Pancreatitis--severe stomach pain that spreads to your back or gets worse after eating or when touched, fever, nausea, vomiting Thoughts of suicide or self-harm, worsening mood, feelings of depression Thyroid cancer--new mass or lump in the neck, pain or trouble swallowing, trouble breathing, hoarseness Side effects that usually do not require medical attention (report these to your care team if they continue or are bothersome): Diarrhea Loss of appetite Nausea Upset stomach This list may not describe all possible side effects. Call your doctor for medical advice about side effects. You may report side effects to FDA at 1-800-FDA-1088. Where should I keep my medication? Keep out of the reach of children and pets. Store at room temperature between 20 and 25 degrees C (68 and 77 degrees F). Keep this medication in the original container. Protect from moisture. Keep the container tightly closed. Get rid of any unused medication after the expiration date. To get rid of medications that are no longer needed or have expired: Take the medication to a medication take-back program. Check with your pharmacy or law enforcement to find a location. If you cannot return the medication, check the label or package insert to see if the medication should be thrown out in the garbage or flushed down the toilet. If you are not sure, ask your care team. If it is safe  to put it in the trash, take the medication out of the container. Mix the medication with cat litter, dirt, coffee grounds, or other unwanted substance. Seal the mixture in a bag or container. Put it in the trash. NOTE: This sheet is a summary. It may not cover all possible information. If you have questions  about this medicine, talk to your doctor, pharmacist, or health care provider.  2024 Elsevier/Gold Standard (2022-10-21 00:00:00)   Diabetes Mellitus and Nutrition, Adult When you have diabetes, or diabetes mellitus, it is very important to have healthy eating habits because your blood sugar (glucose) levels are greatly affected by what you eat and drink. Eating healthy foods in the right amounts, at about the same times every day, can help you: Manage your blood glucose. Lower your risk of heart disease. Improve your blood pressure. Reach or maintain a healthy weight. What can affect my meal plan? Every person with diabetes is different, and each person has different needs for a meal plan. Your health care provider may recommend that you work with a dietitian to make a meal plan that is best for you. Your meal plan may vary depending on factors such as: The calories you need. The medicines you take. Your weight. Your blood glucose, blood pressure, and cholesterol levels. Your activity level. Other health conditions you have, such as heart or kidney disease. How do carbohydrates affect me? Carbohydrates, also called carbs, affect your blood glucose level more than any other type of food. Eating carbs raises the amount of glucose in your blood. It is important to know how many carbs you can safely have in each meal. This is different for every person. Your dietitian can help you calculate how many carbs you should have at each meal and for each snack. How does alcohol affect me? Alcohol can cause a decrease in blood glucose (hypoglycemia), especially if you use insulin or take certain diabetes medicines by mouth. Hypoglycemia can be a life-threatening condition. Symptoms of hypoglycemia, such as sleepiness, dizziness, and confusion, are similar to symptoms of having too much alcohol. Do not drink alcohol if: Your health care provider tells you not to drink. You are pregnant, may be pregnant,  or are planning to become pregnant. If you drink alcohol: Limit how much you have to: 0-1 drink a day for women. 0-2 drinks a day for men. Know how much alcohol is in your drink. In the [place] one drink equals one 12 oz bottle of beer (355 mL), one 5 oz glass of wine (148 mL), or one 1 oz glass of hard liquor (44 mL). Keep yourself hydrated with water, diet soda, or unsweetened iced tea. Keep in mind that regular soda, juice, and other mixers may contain a lot of sugar and must be counted as carbs. What are tips for following this plan?  Reading food labels Start by checking the serving size on the Nutrition Facts label of packaged foods and drinks. The number of calories and the amount of carbs, fats, and other nutrients listed on the label are based on one serving of the item. Many items contain more than one serving per package. Check the total grams (g) of carbs in one serving. Check the number of grams of saturated fats and trans fats in one serving. Choose foods that have a low amount or none of these fats. Check the number of milligrams (mg) of salt (sodium) in one serving. Most people should limit total sodium intake to less than 2,300 mg  per day. Always check the nutrition information of foods labeled as "low-fat" or "nonfat." These foods may be higher in added sugar or refined carbs and should be avoided. Talk to your dietitian to identify your daily goals for nutrients listed on the label. Shopping Avoid buying canned, pre-made, or processed foods. These foods tend to be high in fat, sodium, and added sugar. Shop around the outside edge of the grocery store. This is where you will most often find fresh fruits and vegetables, bulk grains, fresh meats, and fresh dairy products. Cooking Use low-heat cooking methods, such as baking, instead of high-heat cooking methods, such as deep frying. Cook using healthy oils, such as olive, canola, or sunflower oil. Avoid cooking with butter,  cream, or high-fat meats. Meal planning Eat meals and snacks regularly, preferably at the same times every day. Avoid going long periods of time without eating. Eat foods that are high in fiber, such as fresh fruits, vegetables, beans, and whole grains. Eat 4-6 oz (112-168 g) of lean protein each day, such as lean meat, chicken, fish, eggs, or tofu. One ounce (oz) (28 g) of lean protein is equal to: 1 oz (28 g) of meat, chicken, or fish. 1 egg.  cup (62 g) of tofu. Eat some foods each day that contain healthy fats, such as avocado, nuts, seeds, and fish. What foods should I eat? Fruits Berries. Apples. Oranges. Peaches. Apricots. Plums. Grapes. Mangoes. Papayas. Pomegranates. Kiwi. Cherries. Vegetables Leafy greens, including lettuce, spinach, kale, chard, collard greens, mustard greens, and cabbage. Beets. Cauliflower. Broccoli. Carrots. Green beans. Tomatoes. Peppers. Onions. Cucumbers. Brussels sprouts. Grains Whole grains, such as whole-wheat or whole-grain bread, crackers, tortillas, cereal, and pasta. Unsweetened oatmeal. Quinoa. Brown or wild rice. Meats and other proteins Seafood. Poultry without skin. Lean cuts of poultry and beef. Tofu. Nuts. Seeds. Dairy Low-fat or fat-free dairy products such as milk, yogurt, and cheese. The items listed above may not be a complete list of foods and beverages you can eat and drink. Contact a dietitian for more information. What foods should I avoid? Fruits Fruits canned with syrup. Vegetables Canned vegetables. Frozen vegetables with butter or cream sauce. Grains Refined white flour and flour products such as bread, pasta, snack foods, and cereals. Avoid all processed foods. Meats and other proteins Fatty cuts of meat. Poultry with skin. Breaded or fried meats. Processed meat. Avoid saturated fats. Dairy Full-fat yogurt, cheese, or milk. Beverages Sweetened drinks, such as soda or iced tea. The items listed above may not be a complete  list of foods and beverages you should avoid. Contact a dietitian for more information. Questions to ask a health care provider Do I need to meet with a certified diabetes care and education specialist? Do I need to meet with a dietitian? What number can I call if I have questions? When are the best times to check my blood glucose? Where to find more information: American Diabetes Association: diabetes.org Academy of Nutrition and Dietetics: eatright[employer] of Diabetes and Digestive and Kidney Diseases: [email or web address]si Association of Diabetes Care & Education Specialists: diabeteseducator.org Summary It is important to have healthy eating habits because your blood sugar (glucose) levels are greatly affected by what you eat and drink. It is important to use alcohol carefully. A healthy meal plan will help you manage your blood glucose and lower your risk of heart disease. Your health care provider may recommend that you work with a dietitian to make a meal plan that is best for you. This  information is not intended to replace advice given to you by your health care provider. Make sure you discuss any questions you have with your health care provider. Document Revised: 03/16/2020 Document Reviewed: 03/16/2020 Elsevier Patient Education  2024 [employer].

## 2023-03-18 ENCOUNTER — Encounter: Payer: Self-pay | Admitting: Physician Assistant

## 2023-03-20 ENCOUNTER — Ambulatory Visit: Payer: No Typology Code available for payment source | Admitting: Physician Assistant

## 2023-06-11 ENCOUNTER — Other Ambulatory Visit: Payer: Self-pay | Admitting: Physician Assistant

## 2023-06-11 DIAGNOSIS — E1165 Type 2 diabetes mellitus with hyperglycemia: Secondary | ICD-10-CM

## 2023-07-27 ENCOUNTER — Encounter: Payer: Self-pay | Admitting: Physician Assistant

## 2023-07-27 DIAGNOSIS — E1165 Type 2 diabetes mellitus with hyperglycemia: Secondary | ICD-10-CM

## 2023-09-11 ENCOUNTER — Other Ambulatory Visit: Payer: Self-pay | Admitting: Physician Assistant

## 2023-09-11 DIAGNOSIS — E1165 Type 2 diabetes mellitus with hyperglycemia: Secondary | ICD-10-CM
